# Patient Record
Sex: Female | Born: 1960 | Race: White | Hispanic: No | Marital: Single | Smoking: Former smoker
Health system: Southern US, Community
[De-identification: ages and names within clinical notes are randomized; demographics above are authoritative.]

## PROBLEM LIST (undated history)

## (undated) DIAGNOSIS — K859 Acute pancreatitis without necrosis or infection, unspecified: Secondary | ICD-10-CM

## (undated) DIAGNOSIS — R4586 Emotional lability: Secondary | ICD-10-CM

## (undated) DIAGNOSIS — G4700 Insomnia, unspecified: Secondary | ICD-10-CM

## (undated) DIAGNOSIS — M549 Dorsalgia, unspecified: Secondary | ICD-10-CM

## (undated) HISTORY — PX: TUBAL LIGATION: SHX77

---

## 2012-07-12 ENCOUNTER — Emergency Department (HOSPITAL_BASED_OUTPATIENT_CLINIC_OR_DEPARTMENT_OTHER): Payer: Self-pay

## 2012-07-12 ENCOUNTER — Encounter (HOSPITAL_BASED_OUTPATIENT_CLINIC_OR_DEPARTMENT_OTHER): Payer: Self-pay | Admitting: Emergency Medicine

## 2012-07-12 ENCOUNTER — Emergency Department (HOSPITAL_BASED_OUTPATIENT_CLINIC_OR_DEPARTMENT_OTHER)
Admission: EM | Admit: 2012-07-12 | Discharge: 2012-07-12 | Disposition: A | Discharge status: Home / Self Care | Payer: Self-pay | Attending: Emergency Medicine | Admitting: Emergency Medicine

## 2012-07-12 DIAGNOSIS — R9389 Abnormal findings on diagnostic imaging of other specified body structures: Secondary | ICD-10-CM

## 2012-07-12 DIAGNOSIS — J208 Acute bronchitis due to other specified organisms: Secondary | ICD-10-CM

## 2012-07-12 DIAGNOSIS — F17200 Nicotine dependence, unspecified, uncomplicated: Secondary | ICD-10-CM | POA: Insufficient documentation

## 2012-07-12 DIAGNOSIS — J209 Acute bronchitis, unspecified: Secondary | ICD-10-CM | POA: Insufficient documentation

## 2012-07-12 HISTORY — DX: Acute pancreatitis without necrosis or infection, unspecified: K85.90

## 2012-07-12 HISTORY — DX: Emotional lability: R45.86

## 2012-07-12 HISTORY — DX: Insomnia, unspecified: G47.00

## 2012-07-12 MED ORDER — ALBUTEROL SULFATE HFA 108 (90 BASE) MCG/ACT IN AERS
2.0000 | INHALATION_SPRAY | Freq: Once | RESPIRATORY_TRACT | Status: AC
  Administered 2012-07-12: 2 via RESPIRATORY_TRACT
  Filled 2012-07-12: qty 6.7

## 2012-07-12 NOTE — ED Provider Notes (Signed)
History     CSN: [id number]  Arrival date & time 07/12/12  1700   First MD Initiated Contact with Patient 07/12/12 1738      Chief Complaint  Patient presents with  . Cough  . Shortness of Breath  . Back Pain    (Consider location/radiation/quality/duration/timing/severity/associated sxs/prior treatment) HPI This 51 year old female has about a one-week history of cough with right upper back and right upper chest pain with coughing and only minimal shortness breath at times denies shortness today. She has not been wheezing. She is no exertional chest pain. She is no abdominal pain or vomiting. She is no rash or fever. She is no confusion. There is no treatment prior to arrival. Her cough is been persistent for the last week with yellow sputum production so she came to the ED for evaluation. Her chest and back pain are constant 24 hours a day for the last week worse with cough and palpation and position changes but nonexertional. Past Medical History  Diagnosis Date  . Mood swings   . Insomnia   . Pancreatitis     Past Surgical History  Procedure Date  . Tubal ligation     No family history on file.  History  Substance Use Topics  . Smoking status: Current Everyday Smoker -- 0.5 packs/day  . Smokeless tobacco: Never Used  . Alcohol Use: No    OB History    Grav Para Term Preterm Abortions TAB SAB Ect Mult Living                  Review of Systems 10 Systems reviewed and are negative for acute change except as noted in the HPI. Allergies  Review of patient's allergies indicates no known allergies.  Home Medications   Current Outpatient Rx  Name Route Sig Dispense Refill  . GABAPENTIN 800 MG PO TABS Oral Take 800 mg by mouth.    . QUETIAPINE FUMARATE 100 MG PO TABS Oral Take 100 mg by mouth at bedtime.      BP 115/85  Pulse 77  Temp 97.6 F (36.4 C) (Oral)  Resp 20  Ht 5\' 7"  (1.702 m)  Wt 150 lb (68.04 kg)  BMI 23.49 kg/m2  SpO2 100%  Physical Exam    Nursing note and vitals reviewed. Constitutional:       Awake, alert, nontoxic appearance.  HENT:  Head: Atraumatic.  Eyes: Right eye exhibits no discharge. Left eye exhibits no discharge.  Neck: Neck supple.  Cardiovascular: Normal rate and regular rhythm.   No murmur heard. Pulmonary/Chest: Effort normal and breath sounds normal. No respiratory distress. She has no wheezes. She has no rales. She exhibits tenderness.       Reproducible anterior chest wall and right upper back tenderness to palpation without deformity or rash noted  Abdominal: Soft. There is no tenderness. There is no rebound.  Musculoskeletal: She exhibits no edema and no tenderness.       Baseline ROM, no obvious new focal weakness.  Neurological:       Mental status and motor strength appears baseline for patient and situation.  Skin: No rash noted.  Psychiatric: She has a normal mood and affect.    ED Course  Procedures (including critical care time)  Labs Reviewed - No data to display Dg Chest 2 View  07/12/2012  *RADIOLOGY REPORT*  Clinical Data: Cough.  Shortness of breath.  Back pain.  Smoker.  CHEST - 2 VIEW  Comparison: None.  Findings: Vague linear  vertically oriented opacity in the left upper lobe on the PA image, not visualized on the lateral.  Lungs otherwise clear.  Bronchovascular markings normal.  No pleural effusions.  Cardiomediastinal silhouette unremarkable.  Visualized bony thorax intact.  IMPRESSION: Vague linear opacity in the left upper lobe, likely a small focus of scar.  Otherwise normal examination.  Follow-up chest x-ray in 2-3 months is suggested to confirm stability, in the absence of prior examinations for comparison.  Original Report Authenticated By: Arnell Sieving, M.D.     1. Acute viral bronchitis   2. Abnormal finding on chest xray       MDM  Patient / Family / Caregiver informed of clinical course, understand medical decision-making process, and agree with  plan.         Hurman Horn, MD 07/13/12 [phone]

## 2012-07-12 NOTE — ED Notes (Signed)
Cough, SOB and back pain with deep breath for "over a week". Coughing up yellow sputum.

## 2012-07-22 ENCOUNTER — Emergency Department (HOSPITAL_BASED_OUTPATIENT_CLINIC_OR_DEPARTMENT_OTHER)
Admission: EM | Admit: 2012-07-22 | Discharge: 2012-07-22 | Disposition: A | Discharge status: Home / Self Care | Payer: Self-pay | Attending: Emergency Medicine | Admitting: Emergency Medicine

## 2012-07-22 ENCOUNTER — Encounter (HOSPITAL_BASED_OUTPATIENT_CLINIC_OR_DEPARTMENT_OTHER): Payer: Self-pay | Admitting: *Deleted

## 2012-07-22 DIAGNOSIS — K859 Acute pancreatitis without necrosis or infection, unspecified: Secondary | ICD-10-CM | POA: Insufficient documentation

## 2012-07-22 DIAGNOSIS — F17200 Nicotine dependence, unspecified, uncomplicated: Secondary | ICD-10-CM | POA: Insufficient documentation

## 2012-07-22 DIAGNOSIS — G4700 Insomnia, unspecified: Secondary | ICD-10-CM | POA: Insufficient documentation

## 2012-07-22 DIAGNOSIS — J069 Acute upper respiratory infection, unspecified: Secondary | ICD-10-CM | POA: Insufficient documentation

## 2012-07-22 DIAGNOSIS — K0889 Other specified disorders of teeth and supporting structures: Secondary | ICD-10-CM

## 2012-07-22 MED ORDER — HYDROCODONE-ACETAMINOPHEN 5-325 MG PO TABS: 2.0000 | ORAL_TABLET | ORAL | Status: AC | PRN

## 2012-07-22 MED ORDER — CLINDAMYCIN HCL 300 MG PO CAPS: ORAL_CAPSULE | ORAL | Status: DC

## 2012-07-22 NOTE — ED Provider Notes (Signed)
History     CSN: [id number]  Arrival date & time 07/22/12  1619   First MD Initiated Contact with Patient 07/22/12 1655      Chief Complaint  Patient presents with  . URI    (Consider location/radiation/quality/duration/timing/severity/associated sxs/prior treatment) Patient is a 51 y.o. female presenting with URI. The history is provided by the patient. No language interpreter was used.  URI The primary symptoms include ear pain. The current episode started today. This is a new problem.  Symptoms associated with the illness include facial pain and sinus pressure.  Pt complains of pain in her right ear and right face.   Pt thinks pain may be from a broken tooth.  Past Medical History  Diagnosis Date  . Mood swings   . Insomnia   . Pancreatitis     Past Surgical History  Procedure Date  . Tubal ligation     No family history on file.  History  Substance Use Topics  . Smoking status: Current Everyday Smoker -- 0.5 packs/day  . Smokeless tobacco: Never Used  . Alcohol Use: No    OB History    Grav Para Term Preterm Abortions TAB SAB Ect Mult Living                  Review of Systems  HENT: Positive for ear pain and sinus pressure.   All other systems reviewed and are negative.    Allergies  Review of patient's allergies indicates no known allergies.  Home Medications   Current Outpatient Rx  Name Route Sig Dispense Refill  . GABAPENTIN 800 MG PO TABS Oral Take 800 mg by mouth.    . QUETIAPINE FUMARATE 100 MG PO TABS Oral Take 100 mg by mouth at bedtime.      BP 120/74  Pulse 72  Temp 97.8 F (36.6 C) (Oral)  Resp 18  Ht 5\' 7"  (1.702 m)  Wt 154 lb (69.854 kg)  BMI 24.12 kg/m2  SpO2 99%  Physical Exam  Nursing note and vitals reviewed. Constitutional: She is oriented to person, place, and time. She appears well-developed and well-nourished.  HENT:  Head: Normocephalic and atraumatic.       Broken tooth upper right gum.  Swelling around gum    Eyes: Conjunctivae and EOM are normal. Pupils are equal, round, and reactive to light.  Neck: Normal range of motion.  Cardiovascular: Normal rate and normal heart sounds.   Pulmonary/Chest: Effort normal and breath sounds normal.  Abdominal: Soft.  Neurological: She is alert and oriented to person, place, and time.  Skin: Skin is warm.  Psychiatric: She has a normal mood and affect.    ED Course  Procedures (including critical care time)  Labs Reviewed - No data to display No results found.   No diagnosis found.    MDM  Clindamycin and hydrocodone.  Schedule to see Dr. Ninetta Lights.         Lonia Skinner [place], [place] 07/22/12 1757

## 2012-07-22 NOTE — ED Notes (Signed)
Head neck and right ear pain x 1 week.

## 2012-07-22 NOTE — ED Provider Notes (Signed)
Medical screening examination/treatment/procedure(s) were performed by non-physician practitioner and as supervising physician I was immediately available for consultation/collaboration.   Gwyneth Sprout, MD 07/22/12 2308

## 2012-07-28 ENCOUNTER — Emergency Department (HOSPITAL_BASED_OUTPATIENT_CLINIC_OR_DEPARTMENT_OTHER)
Admission: EM | Admit: 2012-07-28 | Discharge: 2012-07-28 | Disposition: A | Discharge status: Home / Self Care | Payer: Self-pay | Attending: Emergency Medicine | Admitting: Emergency Medicine

## 2012-07-28 ENCOUNTER — Encounter (HOSPITAL_BASED_OUTPATIENT_CLINIC_OR_DEPARTMENT_OTHER): Payer: Self-pay | Admitting: Emergency Medicine

## 2012-07-28 DIAGNOSIS — T7840XA Allergy, unspecified, initial encounter: Secondary | ICD-10-CM

## 2012-07-28 DIAGNOSIS — F17200 Nicotine dependence, unspecified, uncomplicated: Secondary | ICD-10-CM | POA: Insufficient documentation

## 2012-07-28 DIAGNOSIS — K859 Acute pancreatitis without necrosis or infection, unspecified: Secondary | ICD-10-CM | POA: Insufficient documentation

## 2012-07-28 DIAGNOSIS — T368X5A Adverse effect of other systemic antibiotics, initial encounter: Secondary | ICD-10-CM | POA: Insufficient documentation

## 2012-07-28 DIAGNOSIS — G4700 Insomnia, unspecified: Secondary | ICD-10-CM | POA: Insufficient documentation

## 2012-07-28 MED ORDER — PENICILLIN V POTASSIUM 250 MG PO TABS
500.0000 mg | ORAL_TABLET | Freq: Once | ORAL | Status: AC
  Administered 2012-07-28: 500 mg via ORAL
  Filled 2012-07-28: qty 2

## 2012-07-28 MED ORDER — PREDNISONE 20 MG PO TABS
40.0000 mg | ORAL_TABLET | Freq: Once | ORAL | Status: AC
  Administered 2012-07-28: 40 mg via ORAL
  Filled 2012-07-28: qty 2

## 2012-07-28 MED ORDER — PREDNISONE 20 MG PO TABS: 40.0000 mg | ORAL_TABLET | Freq: Every day | ORAL | Status: AC

## 2012-07-28 MED ORDER — HYDROXYZINE HCL 25 MG PO TABS: 25.0000 mg | ORAL_TABLET | Freq: Four times a day (QID) | ORAL | Status: AC | PRN

## 2012-07-28 NOTE — ED Notes (Signed)
Pt thinks she might be having a reaction to clindamycin.  Pt has generalized itchy rash.  Also c/o pain and swelling to hands.

## 2012-07-28 NOTE — Discharge Instructions (Signed)

## 2012-07-28 NOTE — ED Provider Notes (Signed)
History     CSN: [id number]  Arrival date & time 07/28/12  1003   First MD Initiated Contact with Patient 07/28/12 1027      Chief Complaint  Patient presents with  . Allergic Reaction    (Consider location/radiation/quality/duration/timing/severity/associated sxs/prior treatment) HPI Comments: Pt was seen on 8/19 and given analgesics and clindamycin abx for dental abscess.  She reports tooth getting better, has not yet made an appt with dentist.  She began developing some itching to scalp a couple of days ago, but now with red rash to medial upper thighs, groin, low back, feet, arms, and has swelling to both hands making it painful to move fingers since yesterday.  Benadryl helped only minimally.  No throat swelling, no SOB, cough, wheezing.  No N/V/D.  She denies any other new meds, soaps, detergents, perfumes.    Patient is a 51 y.o. female presenting with allergic reaction. The history is provided by the patient.  Allergic Reaction The primary symptoms are  rash. The primary symptoms do not include wheezing, shortness of breath, cough, vomiting or diarrhea.    Past Medical History  Diagnosis Date  . Mood swings   . Insomnia   . Pancreatitis     Past Surgical History  Procedure Date  . Tubal ligation     History reviewed. No pertinent family history.  History  Substance Use Topics  . Smoking status: Current Everyday Smoker -- 0.5 packs/day  . Smokeless tobacco: Never Used  . Alcohol Use: No    OB History    Grav Para Term Preterm Abortions TAB SAB Ect Mult Living                  Review of Systems  Constitutional: Negative for fever and chills.  HENT: Positive for dental problem. Negative for trouble swallowing.   Respiratory: Negative for cough, shortness of breath and wheezing.   Gastrointestinal: Negative for vomiting and diarrhea.  Genitourinary: Negative for dysuria and hematuria.  Musculoskeletal: Positive for joint swelling and arthralgias.  Skin:  Positive for rash.  All other systems reviewed and are negative.    Allergies  Review of patient's allergies indicates no known allergies.  Home Medications   Current Outpatient Rx  Name Route Sig Dispense Refill  . CLINDAMYCIN HCL 300 MG PO CAPS  One tablet qid 28 capsule 0  . GABAPENTIN 800 MG PO TABS Oral Take 800 mg by mouth.    Marland Kitchen HYDROCODONE-ACETAMINOPHEN 5-325 MG PO TABS Oral Take 2 tablets by mouth every 4 (four) hours as needed for pain. 20 tablet 0  . HYDROXYZINE HCL 25 MG PO TABS Oral Take 1 tablet (25 mg total) by mouth every 6 (six) hours as needed for itching. 15 tablet 0  . PREDNISONE 20 MG PO TABS Oral Take 2 tablets (40 mg total) by mouth daily. 12 tablet 0  . QUETIAPINE FUMARATE 100 MG PO TABS Oral Take 100 mg by mouth at bedtime.      BP 129/76  Pulse 74  Temp 97.6 F (36.4 C) (Oral)  Resp 20  Ht 5\' 7"  (1.702 m)  Wt 154 lb (69.854 kg)  BMI 24.12 kg/m2  SpO2 100%  Physical Exam  Nursing note and vitals reviewed. Constitutional: She appears well-developed and well-nourished.  HENT:  Head: Normocephalic and atraumatic.  Eyes: Pupils are equal, round, and reactive to light.  Neck: Trachea normal, normal range of motion and phonation normal. Neck supple.  Cardiovascular: Normal rate.   Pulmonary/Chest: Effort normal  and breath sounds normal. No stridor. No respiratory distress. She has no decreased breath sounds. She has no wheezes. She has no rhonchi. She has no rales.  Abdominal: Soft.  Lymphadenopathy:    She has no cervical adenopathy.  Neurological: She is alert.  Skin: Skin is warm and dry. Rash noted. No petechiae and no purpura noted. Rash is maculopapular. Rash is not pustular and not vesicular. She is not diaphoretic. No pallor.    ED Course  Procedures (including critical care time)  Labs Reviewed - No data to display No results found.   1. Allergic reaction to drug    Room air saturation is 100% and I interpret this to be  normal.   MDM  Pt with slightly delayed hypersensitivity, likely to clindamycin.  Will add to allergy list and give prednisone, change to PCN.  Pt urged to follow up with dentist.          Gavin Pound. [place], MD 07/28/12 1044

## 2012-09-24 ENCOUNTER — Encounter (HOSPITAL_BASED_OUTPATIENT_CLINIC_OR_DEPARTMENT_OTHER): Payer: Self-pay

## 2012-09-24 ENCOUNTER — Emergency Department (HOSPITAL_BASED_OUTPATIENT_CLINIC_OR_DEPARTMENT_OTHER)
Admission: EM | Admit: 2012-09-24 | Discharge: 2012-09-24 | Discharge status: Against Medical Advice | Payer: Self-pay | Attending: Emergency Medicine | Admitting: Emergency Medicine

## 2012-09-24 DIAGNOSIS — K089 Disorder of teeth and supporting structures, unspecified: Secondary | ICD-10-CM | POA: Insufficient documentation

## 2012-09-24 NOTE — ED Notes (Signed)
Pt reports dental pain x 1 week.  °

## 2012-09-24 NOTE — ED Notes (Signed)
Pt sts she has to go pick up her grandchildren at school and cannot wait to be seen.  Sts she will be back later today.

## 2012-12-07 ENCOUNTER — Emergency Department (HOSPITAL_BASED_OUTPATIENT_CLINIC_OR_DEPARTMENT_OTHER)
Admission: EM | Admit: 2012-12-07 | Discharge: 2012-12-07 | Disposition: A | Discharge status: Home / Self Care | Payer: Self-pay | Attending: Emergency Medicine | Admitting: Emergency Medicine

## 2012-12-07 ENCOUNTER — Encounter (HOSPITAL_BASED_OUTPATIENT_CLINIC_OR_DEPARTMENT_OTHER): Payer: Self-pay | Admitting: *Deleted

## 2012-12-07 DIAGNOSIS — Z8719 Personal history of other diseases of the digestive system: Secondary | ICD-10-CM | POA: Insufficient documentation

## 2012-12-07 DIAGNOSIS — K0889 Other specified disorders of teeth and supporting structures: Secondary | ICD-10-CM

## 2012-12-07 DIAGNOSIS — F39 Unspecified mood [affective] disorder: Secondary | ICD-10-CM | POA: Insufficient documentation

## 2012-12-07 DIAGNOSIS — Z79899 Other long term (current) drug therapy: Secondary | ICD-10-CM | POA: Insufficient documentation

## 2012-12-07 DIAGNOSIS — K089 Disorder of teeth and supporting structures, unspecified: Secondary | ICD-10-CM | POA: Insufficient documentation

## 2012-12-07 DIAGNOSIS — F17200 Nicotine dependence, unspecified, uncomplicated: Secondary | ICD-10-CM | POA: Insufficient documentation

## 2012-12-07 DIAGNOSIS — Z792 Long term (current) use of antibiotics: Secondary | ICD-10-CM | POA: Insufficient documentation

## 2012-12-07 MED ORDER — OXYCODONE-ACETAMINOPHEN 5-325 MG PO TABS: 12.0000 | ORAL_TABLET | ORAL | Status: DC | PRN

## 2012-12-07 NOTE — ED Provider Notes (Signed)
History     CSN: [id number]  Arrival date & time 12/07/12  2115   First MD Initiated Contact with Patient 12/07/12 2136      Chief Complaint  Patient presents with  . Dental Pain    (Consider location/radiation/quality/duration/timing/severity/associated sxs/prior treatment) HPI Comments: 52 y/o female presents to the ED complaining of right lower tooth pain for the past week worsening over the past 2 days. States she has multiple chipped teeth and occasionally feels pieces chipping off randomly. She does not have a dentist. Describes pain as sharp, rated 10/10 radiating to her right ear. Denies fever, chills or difficulty swallowing. Chewing makes the pain worse. Tried taking ibuprofen without relief.  The history is provided by the patient.    Past Medical History  Diagnosis Date  . Mood swings   . Insomnia   . Pancreatitis     Past Surgical History  Procedure Date  . Tubal ligation     No family history on file.  History  Substance Use Topics  . Smoking status: Current Every Day Smoker -- 0.5 packs/day  . Smokeless tobacco: Never Used  . Alcohol Use: No    OB History    Grav Para Term Preterm Abortions TAB SAB Ect Mult Living                  Review of Systems  Constitutional: Negative for fever and chills.  HENT: Positive for dental problem. Negative for trouble swallowing.   Neurological: Negative for headaches.    Allergies  Clindamycin/lincomycin  Home Medications   Current Outpatient Rx  Name  Route  Sig  Dispense  Refill  . CLINDAMYCIN HCL 300 MG PO CAPS      One tablet qid   28 capsule   0   . GABAPENTIN 800 MG PO TABS   Oral   Take 800 mg by mouth.         . QUETIAPINE FUMARATE 100 MG PO TABS   Oral   Take 100 mg by mouth at bedtime.           BP 130/83  Pulse 76  Temp 97.6 F (36.4 C) (Oral)  Resp 16  SpO2 100%  Physical Exam  Nursing note and vitals reviewed. Constitutional: She is oriented to person, place, and  time. She appears well-developed and well-nourished. No distress.  HENT:  Head: Normocephalic and atraumatic.  Mouth/Throat: Oropharynx is clear and moist. Abnormal dentition. Dental caries present. No dental abscesses.    Eyes: Conjunctivae normal are normal.  Neck: Normal range of motion. Neck supple.  Cardiovascular: Normal rate, regular rhythm and normal heart sounds.   Pulmonary/Chest: Effort normal and breath sounds normal.  Musculoskeletal: Normal range of motion. She exhibits no edema.  Lymphadenopathy:       Head (right side): No submental and no submandibular adenopathy present.       Head (left side): No submental and no submandibular adenopathy present.    She has no cervical adenopathy.  Neurological: She is alert and oriented to person, place, and time.  Skin: Skin is warm.  Psychiatric: She has a normal mood and affect. Her behavior is normal.    ED Course  Procedures (including critical care time)  Labs Reviewed - No data to display No results found.   1. Pain, dental       MDM  51 y/o female with dental pain without dental infection. She is aware she needs to see dentist for her decaying teeth.  Rx percocet #6. Return precautions discussed. Patient states understanding of plan and is agreeable.         Trevor Mace, PA-C 12/07/12 2203

## 2012-12-07 NOTE — ED Notes (Signed)
Pt c/o lower right rear tooth pain x2 days.

## 2012-12-07 NOTE — ED Provider Notes (Signed)
Medical screening examination/treatment/procedure(s) were performed by non-physician practitioner and as supervising physician I was immediately available for consultation/collaboration.   Fatou Dunnigan, MD 12/07/12 2317 

## 2013-01-13 ENCOUNTER — Encounter (HOSPITAL_BASED_OUTPATIENT_CLINIC_OR_DEPARTMENT_OTHER): Payer: Self-pay | Admitting: *Deleted

## 2013-01-13 ENCOUNTER — Emergency Department (HOSPITAL_BASED_OUTPATIENT_CLINIC_OR_DEPARTMENT_OTHER)
Admission: EM | Admit: 2013-01-13 | Discharge: 2013-01-13 | Disposition: A | Discharge status: Home / Self Care | Payer: Self-pay | Attending: Emergency Medicine | Admitting: Emergency Medicine

## 2013-01-13 ENCOUNTER — Emergency Department (HOSPITAL_BASED_OUTPATIENT_CLINIC_OR_DEPARTMENT_OTHER): Payer: Self-pay

## 2013-01-13 DIAGNOSIS — Z8659 Personal history of other mental and behavioral disorders: Secondary | ICD-10-CM | POA: Insufficient documentation

## 2013-01-13 DIAGNOSIS — R059 Cough, unspecified: Secondary | ICD-10-CM | POA: Insufficient documentation

## 2013-01-13 DIAGNOSIS — Z8719 Personal history of other diseases of the digestive system: Secondary | ICD-10-CM | POA: Insufficient documentation

## 2013-01-13 DIAGNOSIS — Z79899 Other long term (current) drug therapy: Secondary | ICD-10-CM | POA: Insufficient documentation

## 2013-01-13 DIAGNOSIS — J40 Bronchitis, not specified as acute or chronic: Secondary | ICD-10-CM

## 2013-01-13 DIAGNOSIS — J209 Acute bronchitis, unspecified: Secondary | ICD-10-CM | POA: Insufficient documentation

## 2013-01-13 DIAGNOSIS — F17200 Nicotine dependence, unspecified, uncomplicated: Secondary | ICD-10-CM | POA: Insufficient documentation

## 2013-01-13 DIAGNOSIS — R05 Cough: Secondary | ICD-10-CM | POA: Insufficient documentation

## 2013-01-13 LAB — URINALYSIS, ROUTINE W REFLEX MICROSCOPIC
Hgb urine dipstick: NEGATIVE
Specific Gravity, Urine: 1.017 (ref 1.005–1.030)
Urobilinogen, UA: 0.2 mg/dL (ref 0.0–1.0)

## 2013-01-13 MED ORDER — CEPHALEXIN 500 MG PO CAPS: 500.0000 mg | ORAL_CAPSULE | Freq: Four times a day (QID) | ORAL | Status: DC

## 2013-01-13 MED ORDER — ALBUTEROL SULFATE HFA 108 (90 BASE) MCG/ACT IN AERS: 1.0000 | INHALATION_SPRAY | Freq: Four times a day (QID) | RESPIRATORY_TRACT | Status: DC | PRN

## 2013-01-13 MED ORDER — HYDROCODONE-ACETAMINOPHEN 5-325 MG PO TABS: 2.0000 | ORAL_TABLET | ORAL | Status: DC | PRN

## 2013-01-13 MED ORDER — ALBUTEROL SULFATE HFA 108 (90 BASE) MCG/ACT IN AERS
2.0000 | INHALATION_SPRAY | RESPIRATORY_TRACT | Status: DC | PRN
  Administered 2013-01-13: 2 via RESPIRATORY_TRACT
  Filled 2013-01-13: qty 6.7

## 2013-01-13 NOTE — ED Notes (Signed)
Pt c/o upper and lower back pain x 1 week

## 2013-01-13 NOTE — ED Provider Notes (Signed)
History     CSN: [id number]  Arrival date & time 01/13/13  8413   First MD Initiated Contact with Patient 01/13/13 1922      Chief Complaint  Patient presents with  . Back Pain    (Consider location/radiation/quality/duration/timing/severity/associated sxs/prior treatment) Patient is a 52 y.o. female presenting with cough. The history is provided by the patient. No language interpreter was used.  Cough Cough characteristics:  Productive Sputum characteristics:  Nondescript Severity:  Moderate Onset quality:  Gradual Timing:  Constant Progression:  Worsening Chronicity:  New Smoker: yes   Relieved by:  Nothing Worsened by:  Nothing tried Ineffective treatments:  None tried Pt complains of a cough and pain in the left side of her back.  Pt reports she has had frequent urination  Past Medical History  Diagnosis Date  . Mood swings   . Insomnia   . Pancreatitis     Past Surgical History  Procedure Laterality Date  . Tubal ligation      History reviewed. No pertinent family history.  History  Substance Use Topics  . Smoking status: Current Every Day Smoker -- 0.50 packs/day    Types: Cigarettes  . Smokeless tobacco: Never Used  . Alcohol Use: No    OB History   Grav Para Term Preterm Abortions TAB SAB Ect Mult Living                  Review of Systems  Respiratory: Positive for cough.   All other systems reviewed and are negative.    Allergies  Clindamycin/lincomycin  Home Medications   Current Outpatient Rx  Name  Route  Sig  Dispense  Refill  . mirtazapine (REMERON) 15 MG tablet   Oral   Take 15 mg by mouth at bedtime.         . clindamycin (CLEOCIN) 300 MG capsule      One tablet qid   28 capsule   0   . gabapentin (NEURONTIN) 800 MG tablet   Oral   Take 800 mg by mouth.         . oxyCODONE-acetaminophen (PERCOCET) 5-325 MG per tablet   Oral   Take 12 tablets by mouth every 4 (four) hours as needed for pain.   6 tablet   0    . QUEtiapine (SEROQUEL) 100 MG tablet   Oral   Take 100 mg by mouth at bedtime.           BP 133/74  Pulse 78  Temp(Src) 97.6 F (36.4 C) (Oral)  Resp 16  SpO2 99%  Physical Exam  Nursing note and vitals reviewed. Constitutional: She appears well-developed and well-nourished.  HENT:  Head: Normocephalic.  Right Ear: External ear normal.  Left Ear: External ear normal.  Eyes: Conjunctivae are normal. Pupils are equal, round, and reactive to light.  Neck: Normal range of motion. Neck supple.  Cardiovascular: Normal rate and normal heart sounds.   Pulmonary/Chest: Effort normal.  Abdominal: Soft.  Musculoskeletal: Normal range of motion.  Neurological: She is alert.  Skin: Skin is warm.  Psychiatric: She has a normal mood and affect.    ED Course  Procedures (including critical care time)  Labs Reviewed  URINALYSIS, ROUTINE W REFLEX MICROSCOPIC   Dg Chest 2 View  01/13/2013  *RADIOLOGY REPORT*  Clinical Data: Cough.  Bronchitis.  Tobacco use.  CHEST - 2 VIEW  Comparison: 07/12/2012  Findings: Cardiac and mediastinal contours appear normal.  The lungs appear clear.  No pleural  effusion is identified.  Prior linear opacity at the left lung apex has resolved.  IMPRESSION:  1.  No significant abnormality identified.   Original Report Authenticated By: Gaylyn Rong, M.D.      No diagnosis found.    MDM  Pt given rx for zithromax and albuterol,  Hydrocodone for pain.   Pt advised to see her MD for recheck in 3-4 days        Lonia Skinner [place], [place] 01/13/13 2024

## 2013-01-13 NOTE — ED Notes (Signed)
Pt requested albuterol inhaler to go due to expense-states she can afford the abx abd pain med-EDPA notified-orders for albuterol inhaler

## 2013-01-13 NOTE — ED Provider Notes (Signed)
Medical screening examination/treatment/procedure(s) were performed by non-physician practitioner and as supervising physician I was immediately available for consultation/collaboration.   Billie Intriago, MD 01/13/13 2257 

## 2013-07-20 ENCOUNTER — Encounter (HOSPITAL_BASED_OUTPATIENT_CLINIC_OR_DEPARTMENT_OTHER): Payer: Self-pay

## 2013-07-20 ENCOUNTER — Emergency Department (HOSPITAL_BASED_OUTPATIENT_CLINIC_OR_DEPARTMENT_OTHER)
Admission: EM | Admit: 2013-07-20 | Discharge: 2013-07-20 | Discharge status: Against Medical Advice | Payer: BC Managed Care – PPO | Attending: Emergency Medicine | Admitting: Emergency Medicine

## 2013-07-20 DIAGNOSIS — R1110 Vomiting, unspecified: Secondary | ICD-10-CM | POA: Insufficient documentation

## 2013-07-20 DIAGNOSIS — F17200 Nicotine dependence, unspecified, uncomplicated: Secondary | ICD-10-CM | POA: Insufficient documentation

## 2013-07-20 DIAGNOSIS — R109 Unspecified abdominal pain: Secondary | ICD-10-CM | POA: Insufficient documentation

## 2013-07-20 LAB — URINALYSIS, ROUTINE W REFLEX MICROSCOPIC
Glucose, UA: NEGATIVE mg/dL
Protein, ur: NEGATIVE mg/dL
Urobilinogen, UA: 0.2 mg/dL (ref 0.0–1.0)

## 2013-07-20 LAB — URINE MICROSCOPIC-ADD ON

## 2013-07-20 NOTE — ED Notes (Signed)
Called in waiting area x1, no answer 

## 2013-07-20 NOTE — ED Notes (Signed)
Patient here with upper epigastric pain x 1 week with radiation to scapula. Pain awakening her at night with vomiting, worse after any solid intake

## 2013-07-22 LAB — URINE CULTURE

## 2013-07-24 ENCOUNTER — Encounter (HOSPITAL_BASED_OUTPATIENT_CLINIC_OR_DEPARTMENT_OTHER): Payer: Self-pay | Admitting: Emergency Medicine

## 2013-07-24 ENCOUNTER — Emergency Department (HOSPITAL_BASED_OUTPATIENT_CLINIC_OR_DEPARTMENT_OTHER): Payer: BC Managed Care – PPO

## 2013-07-24 ENCOUNTER — Emergency Department (HOSPITAL_BASED_OUTPATIENT_CLINIC_OR_DEPARTMENT_OTHER)
Admission: EM | Admit: 2013-07-24 | Discharge: 2013-07-25 | Disposition: A | Discharge status: Home / Self Care | Payer: BC Managed Care – PPO | Attending: Emergency Medicine | Admitting: Emergency Medicine

## 2013-07-24 DIAGNOSIS — F17200 Nicotine dependence, unspecified, uncomplicated: Secondary | ICD-10-CM | POA: Insufficient documentation

## 2013-07-24 DIAGNOSIS — F39 Unspecified mood [affective] disorder: Secondary | ICD-10-CM | POA: Insufficient documentation

## 2013-07-24 DIAGNOSIS — Z8719 Personal history of other diseases of the digestive system: Secondary | ICD-10-CM | POA: Insufficient documentation

## 2013-07-24 DIAGNOSIS — G4700 Insomnia, unspecified: Secondary | ICD-10-CM | POA: Insufficient documentation

## 2013-07-24 DIAGNOSIS — Z79899 Other long term (current) drug therapy: Secondary | ICD-10-CM | POA: Insufficient documentation

## 2013-07-24 DIAGNOSIS — N390 Urinary tract infection, site not specified: Secondary | ICD-10-CM | POA: Insufficient documentation

## 2013-07-24 LAB — COMPREHENSIVE METABOLIC PANEL
Albumin: 4.9 g/dL (ref 3.5–5.2)
Alkaline Phosphatase: 87 U/L (ref 39–117)
BUN: 22 mg/dL (ref 6–23)
Calcium: 11.8 mg/dL — ABNORMAL HIGH (ref 8.4–10.5)
Potassium: 3.9 mEq/L (ref 3.5–5.1)
Sodium: 140 mEq/L (ref 135–145)
Total Protein: 8.3 g/dL (ref 6.0–8.3)

## 2013-07-24 LAB — URINALYSIS, ROUTINE W REFLEX MICROSCOPIC
Ketones, ur: 15 mg/dL — AB
Nitrite: POSITIVE — AB
Protein, ur: 30 mg/dL — AB
Urobilinogen, UA: 1 mg/dL (ref 0.0–1.0)

## 2013-07-24 LAB — URINE MICROSCOPIC-ADD ON

## 2013-07-24 LAB — CBC WITH DIFFERENTIAL/PLATELET
Basophils Relative: 0 % (ref 0–1)
Eosinophils Absolute: 0.1 10*3/uL (ref 0.0–0.7)
MCH: 33 pg (ref 26.0–34.0)
MCHC: 34.5 g/dL (ref 30.0–36.0)
Neutrophils Relative %: 52 % (ref 43–77)
Platelets: 209 10*3/uL (ref 150–400)

## 2013-07-24 LAB — LIPASE, BLOOD: Lipase: 41 U/L (ref 11–59)

## 2013-07-24 MED ORDER — ONDANSETRON 8 MG PO TBDP: 8.0000 mg | ORAL_TABLET | Freq: Three times a day (TID) | ORAL | Status: DC | PRN

## 2013-07-24 MED ORDER — DEXTROSE 5 % IV SOLN
1.0000 g | Freq: Once | INTRAVENOUS | Status: AC
  Administered 2013-07-24: 1 g via INTRAVENOUS
  Filled 2013-07-24 (×2): qty 10

## 2013-07-24 MED ORDER — SODIUM CHLORIDE 0.9 % IV BOLUS (SEPSIS)
1000.0000 mL | Freq: Once | INTRAVENOUS | Status: AC
  Administered 2013-07-24: 1000 mL via INTRAVENOUS

## 2013-07-24 MED ORDER — ONDANSETRON HCL 4 MG/2ML IJ SOLN
4.0000 mg | Freq: Once | INTRAMUSCULAR | Status: AC
  Administered 2013-07-24: 4 mg via INTRAVENOUS
  Filled 2013-07-24: qty 2

## 2013-07-24 MED ORDER — CEPHALEXIN 500 MG PO CAPS: 500.0000 mg | ORAL_CAPSULE | Freq: Three times a day (TID) | ORAL | Status: DC

## 2013-07-24 MED ORDER — OXYCODONE-ACETAMINOPHEN 5-325 MG PO TABS: 2.0000 | ORAL_TABLET | ORAL | Status: DC | PRN

## 2013-07-24 NOTE — ED Notes (Signed)
Pt c/o mid-abd pain through to back x 1 month. Pt reports nausea.

## 2013-07-24 NOTE — ED Notes (Signed)
MD at bedside giving test results and plan of care for DC. 

## 2013-07-24 NOTE — ED Provider Notes (Signed)
CSN: [id number]     Arrival date & time 07/24/13  1953 History     First MD Initiated Contact with Patient 07/24/13 2052     Chief Complaint  Patient presents with  . Abdominal Pain   (Consider location/radiation/quality/duration/timing/severity/associated sxs/prior Treatment) HPI Patient with intermittent abdominal pain for two weeks worse with food.  Sharp in luq to left flank.  She has not had nausea or vomiting or change in weight.  She states greasy food makes it worse.  She has not had fever or chills.and denies alcohol intake.   Past Medical History  Diagnosis Date  . Mood swings   . Insomnia   . Pancreatitis    Past Surgical History  Procedure Laterality Date  . Tubal ligation     No family history on file. History  Substance Use Topics  . Smoking status: Current Every Day Smoker -- 0.50 packs/day    Types: Cigarettes  . Smokeless tobacco: Never Used  . Alcohol Use: No   OB History   Grav Para Term Preterm Abortions TAB SAB Ect Mult Living                 Review of Systems  All other systems reviewed and are negative.    Allergies  Clindamycin/lincomycin  Home Medications   Current Outpatient Rx  Name  Route  Sig  Dispense  Refill  . gabapentin (NEURONTIN) 400 MG capsule   Oral   Take 400 mg by mouth daily.         Marland Kitchen gabapentin (NEURONTIN) 800 MG tablet   Oral   Take 400 mg by mouth.           BP 102/69  Pulse 88  Temp(Src) 97.7 F (36.5 C) (Oral)  Resp 18  Ht 5\' 7"  (1.702 m)  Wt 164 lb (74.39 kg)  BMI 25.68 kg/m2  SpO2 100% Physical Exam  Nursing note and vitals reviewed. Constitutional: She is oriented to person, place, and time. She appears well-developed and well-nourished.  HENT:  Head: Normocephalic and atraumatic.  Right Ear: External ear normal.  Left Ear: External ear normal.  Nose: Nose normal.  Mouth/Throat: Oropharynx is clear and moist.  Eyes: Conjunctivae and EOM are normal. Pupils are equal, round, and reactive to  light.  Neck: Normal range of motion. Neck supple.  Cardiovascular: Normal rate, regular rhythm, normal heart sounds and intact distal pulses.   Pulmonary/Chest: Effort normal and breath sounds normal.  Abdominal: Soft. Bowel sounds are normal. There is tenderness.  Mild ttp epigastrium and luq  Musculoskeletal: Normal range of motion.  Neurological: She is alert and oriented to person, place, and time. She has normal reflexes.  Skin: Skin is warm and dry.  Psychiatric: She has a normal mood and affect. Her behavior is normal. Judgment and thought content normal.    ED Course   Procedures (including critical care time)  Labs Reviewed  URINALYSIS, ROUTINE W REFLEX MICROSCOPIC - Abnormal; Notable for the following:    Color, Urine AMBER (*)    APPearance CLOUDY (*)    Bilirubin Urine SMALL (*)    Ketones, ur 15 (*)    Protein, ur 30 (*)    Nitrite POSITIVE (*)    Leukocytes, UA LARGE (*)    All other components within normal limits  COMPREHENSIVE METABOLIC PANEL - Abnormal; Notable for the following:    Creatinine, Ser 1.40 (*)    Calcium 11.8 (*)    GFR calc non Af Amer 42 (*)  GFR calc Af Amer 49 (*)    All other components within normal limits  URINE MICROSCOPIC-ADD ON - Abnormal; Notable for the following:    Squamous Epithelial / LPF FEW (*)    Bacteria, UA MANY (*)    All other components within normal limits  URINE CULTURE  CBC WITH DIFFERENTIAL  LIPASE, BLOOD   Ct Abdomen Pelvis Wo Contrast  07/24/2013   *RADIOLOGY REPORT*  Clinical Data: Left flank pain with nausea and vomiting.  CT ABDOMEN AND PELVIS WITHOUT CONTRAST  Technique:  Multidetector CT imaging of the abdomen and pelvis was performed following the standard protocol without intravenous contrast.  Comparison: None.  Findings: The patient has numerous cysts throughout the liver. Parenchyma is otherwise normal.  The common bile duct is dilated to a diameter of 8 mm and on image number 25 of series 2 there is  suggestion of possible filling defects in the common bile duct.  Is the patient's bilirubin normal?  The spleen, pancreas, and adrenal glands are normal.  Right kidney is normal.  There is a 12 mm low density lesion in the anterior aspect the midportion of the left kidney, statistically a cyst but this is indeterminate on this unenhanced scan.  There are no renal or ureteral calculi.  No hydronephrosis.  Bladder appears normal.  Uterus and ovaries are normal.  The bowel appears normal.  The cecum lies low in the pelvis adjacent to the rectum.  Appendix is normal as is the terminal ileum.  No free air or free fluid in the abdomen or pelvis.  No acute osseous abnormality.  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  IMPRESSION:  1.  Slight dilatation of the common bile duct with a possible stone in the common bile duct. 2.  No other significant abnormality of the abdomen or pelvis. Numerous benign cysts in the liver. 3.  Indeterminate low density lesion in the midportion of the left kidney, statistically most likely a cyst.  This could be assessed by ultrasound of the kidney on an elective basis.   Original Report Authenticated By: Francene Boyers, M.D.   No diagnosis found.  MDM  1- luq pain- work up c.w. Cbd, no elevated lft or lipase.  Fu discussed with Dr.Perry and he states she can call office for f/u.  Discussed with patient and advised regarding return precautions.  2 uti- patient treated here with rocephin 3- hypercalcemia- plan f/u for recheck and further work up with pmd   Hilario Quarry, MD 07/24/13 2359

## 2013-07-24 NOTE — ED Notes (Signed)
Pt states abd pain seems to be worsened by certain foods after eating.

## 2013-07-26 LAB — URINE CULTURE

## 2013-07-27 ENCOUNTER — Telehealth (HOSPITAL_COMMUNITY): Payer: Self-pay | Admitting: Emergency Medicine

## 2013-07-27 NOTE — ED Notes (Signed)
Post ED Visit - Positive Culture Follow-up  Culture report reviewed by antimicrobial stewardship pharmacist: []  Gwendolyn Page, Pharm.D., BCPS []  Gwendolyn Page, Pharm.D., BCPS [x]  Gwendolyn Page, Pharm.D., BCPS []  Gwendolyn Page, [address].D., BCPS, AAHIVP []  Gwendolyn Page, Pharm.D., BCPS, AAHIVP  Positive urine culture Treated with Keflex, organism sensitive to the same and no further patient follow-up is required at this time.  Gwendolyn Page 07/27/2013, 3:07 PM

## 2013-10-08 ENCOUNTER — Encounter (HOSPITAL_BASED_OUTPATIENT_CLINIC_OR_DEPARTMENT_OTHER): Payer: Self-pay | Admitting: Emergency Medicine

## 2013-10-08 ENCOUNTER — Emergency Department (HOSPITAL_BASED_OUTPATIENT_CLINIC_OR_DEPARTMENT_OTHER)
Admission: EM | Admit: 2013-10-08 | Discharge: 2013-10-08 | Disposition: A | Discharge status: Home / Self Care | Payer: BC Managed Care – PPO | Attending: Emergency Medicine | Admitting: Emergency Medicine

## 2013-10-08 DIAGNOSIS — Z79899 Other long term (current) drug therapy: Secondary | ICD-10-CM | POA: Insufficient documentation

## 2013-10-08 DIAGNOSIS — M5432 Sciatica, left side: Secondary | ICD-10-CM

## 2013-10-08 DIAGNOSIS — Z8659 Personal history of other mental and behavioral disorders: Secondary | ICD-10-CM | POA: Insufficient documentation

## 2013-10-08 DIAGNOSIS — Z8719 Personal history of other diseases of the digestive system: Secondary | ICD-10-CM | POA: Insufficient documentation

## 2013-10-08 DIAGNOSIS — Z792 Long term (current) use of antibiotics: Secondary | ICD-10-CM | POA: Insufficient documentation

## 2013-10-08 DIAGNOSIS — F17200 Nicotine dependence, unspecified, uncomplicated: Secondary | ICD-10-CM | POA: Insufficient documentation

## 2013-10-08 DIAGNOSIS — M5430 Sciatica, unspecified side: Secondary | ICD-10-CM | POA: Insufficient documentation

## 2013-10-08 LAB — URINALYSIS, ROUTINE W REFLEX MICROSCOPIC
Bilirubin Urine: NEGATIVE
Leukocytes, UA: NEGATIVE
Nitrite: NEGATIVE
Specific Gravity, Urine: 1.014 (ref 1.005–1.030)
pH: 6 (ref 5.0–8.0)

## 2013-10-08 MED ORDER — KETOROLAC TROMETHAMINE 60 MG/2ML IM SOLN
60.0000 mg | Freq: Once | INTRAMUSCULAR | Status: AC
  Administered 2013-10-08: 60 mg via INTRAMUSCULAR
  Filled 2013-10-08: qty 2

## 2013-10-08 MED ORDER — CYCLOBENZAPRINE HCL 10 MG PO TABS
10.0000 mg | ORAL_TABLET | Freq: Once | ORAL | Status: DC
  Filled 2013-10-08: qty 1

## 2013-10-08 MED ORDER — HYDROCODONE-ACETAMINOPHEN 5-325 MG PO TABS: 1.0000 | ORAL_TABLET | Freq: Four times a day (QID) | ORAL | Status: DC | PRN

## 2013-10-08 MED ORDER — CYCLOBENZAPRINE HCL 10 MG PO TABS: 10.0000 mg | ORAL_TABLET | Freq: Two times a day (BID) | ORAL | Status: DC | PRN

## 2013-10-08 NOTE — ED Notes (Signed)
C/o left hip pain x 3 weeks-denies injury-pain worse this am-steady gait int traige

## 2013-10-08 NOTE — ED Provider Notes (Signed)
CSN: [id number]     Arrival date & time 10/08/13  1121 History   First MD Initiated Contact with Patient 10/08/13 1158     Chief Complaint  Patient presents with  . Hip Pain   (Consider location/radiation/quality/duration/timing/severity/associated sxs/prior Treatment) HPI Comments: Noticed left lower back pain at work 3 weeks ago.   Patient is a 52 y.o. female presenting with hip pain. The history is provided by the patient.  Hip Pain This is a new problem. The current episode started more than 1 week ago. The problem occurs constantly. The problem has been gradually worsening. Pertinent negatives include no abdominal pain and no shortness of breath. The symptoms are aggravated by bending, walking and twisting. Nothing relieves the symptoms. She has tried nothing for the symptoms.    Past Medical History  Diagnosis Date  . Mood swings   . Insomnia   . Pancreatitis    Past Surgical History  Procedure Laterality Date  . Tubal ligation     No family history on file. History  Substance Use Topics  . Smoking status: Current Every Day Smoker -- 0.50 packs/day    Types: Cigarettes  . Smokeless tobacco: Never Used  . Alcohol Use: No   OB History   Grav Para Term Preterm Abortions TAB SAB Ect Mult Living                 Review of Systems  Constitutional: Negative for fever.  Respiratory: Negative for cough and shortness of breath.   Gastrointestinal: Negative for vomiting and abdominal pain.  All other systems reviewed and are negative.    Allergies  Clindamycin/lincomycin  Home Medications   Current Outpatient Rx  Name  Route  Sig  Dispense  Refill  . cephALEXin (KEFLEX) 500 MG capsule   Oral   Take 1 capsule (500 mg total) by mouth 3 (three) times daily.   20 capsule   0   . gabapentin (NEURONTIN) 400 MG capsule   Oral   Take 400 mg by mouth daily.         Marland Kitchen gabapentin (NEURONTIN) 800 MG tablet   Oral   Take 400 mg by mouth.          Marland Kitchen  HYDROcodone-acetaminophen (NORCO/VICODIN) 5-325 MG per tablet   Oral   Take 1 tablet by mouth every 6 (six) hours as needed for moderate pain.   15 tablet   0   . ondansetron (ZOFRAN ODT) 8 MG disintegrating tablet   Oral   Take 1 tablet (8 mg total) by mouth every 8 (eight) hours as needed for nausea.   20 tablet   0   . oxyCODONE-acetaminophen (PERCOCET/ROXICET) 5-325 MG per tablet   Oral   Take 2 tablets by mouth every 4 (four) hours as needed for pain.   15 tablet   0    BP 124/63  Pulse 80  Temp(Src) 97.8 F (36.6 C) (Oral)  Resp 16  Ht 5\' 7"  (1.702 m)  Wt 160 lb (72.576 kg)  BMI 25.05 kg/m2  SpO2 98% Physical Exam  Nursing note and vitals reviewed. Constitutional: She is oriented to person, place, and time. She appears well-developed and well-nourished. No distress.  HENT:  Head: Normocephalic and atraumatic.  Eyes: EOM are normal. Pupils are equal, round, and reactive to light.  Neck: Normal range of motion. Neck supple.  Cardiovascular: Normal rate and regular rhythm.  Exam reveals no friction rub.   No murmur heard. Pulmonary/Chest: Effort normal and breath  sounds normal. No respiratory distress. She has no wheezes. She has no rales.  Abdominal: Soft. She exhibits no distension. There is no tenderness. There is no rebound.  Musculoskeletal: Normal range of motion. She exhibits no edema.       Lumbar back: She exhibits tenderness (L flank).  Neurological: She is alert and oriented to person, place, and time.  Skin: She is not diaphoretic.    ED Course  Procedures (including critical care time) Labs Review Labs Reviewed - No data to display Imaging Review No results found.  EKG Interpretation   None       MDM   1. Sciatica neuralgia, left    48F here with L sided flank pain. Radiating down into leg. Denies bladder incontinence, urinary retention, bowel incontinence. Patient denies numbness, tingling in leg. No vomiting, fever, diarrhea. Pain  started when working, worse with activity, bending over. Patient's exam benign except for L lower lumbar pain. Likely sciatica with radiation to L posterior thigh. Will give toradol and flexeril, sent home with Rx for vicodin. UA sent for urinary urgency. No dysuria, but is having some frequency.    Dagmar Hait, MD 10/08/13 [phone]

## 2013-10-08 NOTE — Discharge Instructions (Signed)
Sciatica °Sciatica is pain, weakness, numbness, or tingling along the path of the sciatic nerve. The nerve starts in the lower back and runs down the back of each leg. The nerve controls the muscles in the lower leg and in the back of the knee, while also providing sensation to the back of the thigh, lower leg, and the sole of your foot. Sciatica is a symptom of another medical condition. For instance, nerve damage or certain conditions, such as a herniated disk or bone spur on the spine, pinch or put pressure on the sciatic nerve. This causes the pain, weakness, or other sensations normally associated with sciatica. Generally, sciatica only affects one side of the body. °CAUSES  °· Herniated or slipped disc. °· Degenerative disk disease. °· A pain disorder involving the narrow muscle in the buttocks (piriformis syndrome). °· Pelvic injury or fracture. °· Pregnancy. °· Tumor (rare). °SYMPTOMS  °Symptoms can vary from mild to very severe. The symptoms usually travel from the low back to the buttocks and down the back of the leg. Symptoms can include: °· Mild tingling or dull aches in the lower back, leg, or hip. °· Numbness in the back of the calf or sole of the foot. °· Burning sensations in the lower back, leg, or hip. °· Sharp pains in the lower back, leg, or hip. °· Leg weakness. °· Severe back pain inhibiting movement. °These symptoms may get worse with coughing, sneezing, laughing, or prolonged sitting or standing. Also, being overweight may worsen symptoms. °DIAGNOSIS  °Your caregiver will perform a physical exam to look for common symptoms of sciatica. He or she may ask you to do certain movements or activities that would trigger sciatic nerve pain. Other tests may be performed to find the cause of the sciatica. These may include: °· Blood tests. °· X-rays. °· Imaging tests, such as an MRI or CT scan. °TREATMENT  °Treatment is directed at the cause of the sciatic pain. Sometimes, treatment is not necessary  and the pain and discomfort goes away on its own. If treatment is needed, your caregiver may suggest: °· Over-the-counter medicines to relieve pain. °· Prescription medicines, such as anti-inflammatory medicine, muscle relaxants, or narcotics. °· Applying heat or ice to the painful area. °· Steroid injections to lessen pain, irritation, and inflammation around the nerve. °· Reducing activity during periods of pain. °· Exercising and stretching to strengthen your abdomen and improve flexibility of your spine. Your caregiver may suggest losing weight if the extra weight makes the back pain worse. °· Physical therapy. °· Surgery to eliminate what is pressing or pinching the nerve, such as a bone spur or part of a herniated disk. °HOME CARE INSTRUCTIONS  °· Only take over-the-counter or prescription medicines for pain or discomfort as directed by your caregiver. °· Apply ice to the affected area for 20 minutes, 3 4 times a day for the first 48 72 hours. Then try heat in the same way. °· Exercise, stretch, or perform your usual activities if these do not aggravate your pain. °· Attend physical therapy sessions as directed by your caregiver. °· Keep all follow-up appointments as directed by your caregiver. °· Do not wear high heels or shoes that do not provide proper support. °· Check your mattress to see if it is too soft. A firm mattress may lessen your pain and discomfort. °SEEK IMMEDIATE MEDICAL CARE IF:  °· You lose control of your bowel or bladder (incontinence). °· You have increasing weakness in the lower back,   pelvis, buttocks, or legs. °· You have redness or swelling of your back. °· You have a burning sensation when you urinate. °· You have pain that gets worse when you lie down or awakens you at night. °· Your pain is worse than you have experienced in the past. °· Your pain is lasting longer than 4 weeks. °· You are suddenly losing weight without reason. °MAKE SURE YOU: °· Understand these  instructions. °· Will watch your condition. °· Will get help right away if you are not doing well or get worse. °Document Released: 11/14/2001 Document Revised: 05/21/2012 Document Reviewed: 03/31/2012 °[employer]® Patient Information ©2014 [employer]. ° °

## 2013-11-03 ENCOUNTER — Emergency Department (HOSPITAL_BASED_OUTPATIENT_CLINIC_OR_DEPARTMENT_OTHER)
Admission: EM | Admit: 2013-11-03 | Discharge: 2013-11-03 | Disposition: A | Discharge status: Home / Self Care | Payer: BC Managed Care – PPO | Attending: Emergency Medicine | Admitting: Emergency Medicine

## 2013-11-03 ENCOUNTER — Encounter (HOSPITAL_BASED_OUTPATIENT_CLINIC_OR_DEPARTMENT_OTHER): Payer: Self-pay | Admitting: Emergency Medicine

## 2013-11-03 DIAGNOSIS — Z79899 Other long term (current) drug therapy: Secondary | ICD-10-CM | POA: Insufficient documentation

## 2013-11-03 DIAGNOSIS — T7840XA Allergy, unspecified, initial encounter: Secondary | ICD-10-CM

## 2013-11-03 DIAGNOSIS — F17200 Nicotine dependence, unspecified, uncomplicated: Secondary | ICD-10-CM | POA: Insufficient documentation

## 2013-11-03 DIAGNOSIS — R21 Rash and other nonspecific skin eruption: Secondary | ICD-10-CM | POA: Insufficient documentation

## 2013-11-03 DIAGNOSIS — Z8719 Personal history of other diseases of the digestive system: Secondary | ICD-10-CM | POA: Insufficient documentation

## 2013-11-03 DIAGNOSIS — Z792 Long term (current) use of antibiotics: Secondary | ICD-10-CM | POA: Insufficient documentation

## 2013-11-03 DIAGNOSIS — Z8659 Personal history of other mental and behavioral disorders: Secondary | ICD-10-CM | POA: Insufficient documentation

## 2013-11-03 MED ORDER — PREDNISONE 20 MG PO TABS: 40.0000 mg | ORAL_TABLET | Freq: Every day | ORAL | Status: DC

## 2013-11-03 MED ORDER — DIPHENHYDRAMINE HCL 25 MG PO CAPS
25.0000 mg | ORAL_CAPSULE | Freq: Once | ORAL | Status: AC
  Administered 2013-11-03: 25 mg via ORAL
  Filled 2013-11-03: qty 1

## 2013-11-03 MED ORDER — FAMOTIDINE 20 MG PO TABS: 20.0000 mg | ORAL_TABLET | Freq: Two times a day (BID) | ORAL | Status: DC

## 2013-11-03 MED ORDER — PREDNISONE 50 MG PO TABS
60.0000 mg | ORAL_TABLET | Freq: Once | ORAL | Status: AC
  Administered 2013-11-03: 60 mg via ORAL
  Filled 2013-11-03 (×2): qty 1

## 2013-11-03 MED ORDER — FAMOTIDINE 20 MG PO TABS
40.0000 mg | ORAL_TABLET | Freq: Once | ORAL | Status: AC
  Administered 2013-11-03: 40 mg via ORAL
  Filled 2013-11-03: qty 2

## 2013-11-03 NOTE — ED Provider Notes (Signed)
CSN: [id number]     Arrival date & time 11/03/13  1159 History   First MD Initiated Contact with Patient 11/03/13 1215     Chief Complaint  Patient presents with  . Facial Swelling   (Consider location/radiation/quality/duration/timing/severity/associated sxs/prior Treatment) Patient is a 52 y.o. female presenting with rash. The history is provided by the patient.  Rash Location:  Shoulder/arm, leg and face Facial rash location:  L eyelid and R eyelid Shoulder/arm rash location:  L hand, R hand, R forearm and L forearm Leg rash location:  L upper leg and R upper leg Quality: burning, painful and redness   Quality: not blistering, not draining, not peeling, not scaling and not weeping   Pain details:    Quality:  Aching and burning   Severity:  Moderate   Onset quality:  Gradual   Duration:  24 hours   Timing:  Constant   Progression:  Worsening Severity:  Moderate Onset quality:  Gradual Timing:  Constant Progression:  Worsening Chronicity:  New Context comment:  Did use a new cleaner at work last night and also ate a chicken gumbo Relieved by:  Nothing Worsened by:  Nothing tried Ineffective treatments:  None tried Associated symptoms: no diarrhea, no fever, no headaches, no nausea, no shortness of breath, no sore throat, no throat swelling and no tongue swelling     Past Medical History  Diagnosis Date  . Mood swings   . Insomnia   . Pancreatitis    Past Surgical History  Procedure Laterality Date  . Tubal ligation     No family history on file. History  Substance Use Topics  . Smoking status: Current Every Day Smoker -- 0.50 packs/day    Types: Cigarettes  . Smokeless tobacco: Never Used  . Alcohol Use: No   OB History   Grav Para Term Preterm Abortions TAB SAB Ect Mult Living                 Review of Systems  Constitutional: Negative for fever.  HENT: Negative for sore throat.   Respiratory: Negative for shortness of breath.   Gastrointestinal:  Negative for nausea and diarrhea.  Skin: Positive for rash.  Neurological: Negative for headaches.  All other systems reviewed and are negative.    Allergies  Clindamycin/lincomycin  Home Medications   Current Outpatient Rx  Name  Route  Sig  Dispense  Refill  . cephALEXin (KEFLEX) 500 MG capsule   Oral   Take 1 capsule (500 mg total) by mouth 3 (three) times daily.   20 capsule   0   . cyclobenzaprine (FLEXERIL) 10 MG tablet   Oral   Take 1 tablet (10 mg total) by mouth 2 (two) times daily as needed for muscle spasms.   20 tablet   0   . gabapentin (NEURONTIN) 400 MG capsule   Oral   Take 400 mg by mouth daily.         Marland Kitchen gabapentin (NEURONTIN) 800 MG tablet   Oral   Take 400 mg by mouth.          Marland Kitchen HYDROcodone-acetaminophen (NORCO/VICODIN) 5-325 MG per tablet   Oral   Take 1 tablet by mouth every 6 (six) hours as needed for moderate pain.   15 tablet   0   . ondansetron (ZOFRAN ODT) 8 MG disintegrating tablet   Oral   Take 1 tablet (8 mg total) by mouth every 8 (eight) hours as needed for nausea.   20  tablet   0   . oxyCODONE-acetaminophen (PERCOCET/ROXICET) 5-325 MG per tablet   Oral   Take 2 tablets by mouth every 4 (four) hours as needed for pain.   15 tablet   0    BP 116/85  Pulse 98  Temp(Src) 97.6 F (36.4 C) (Oral)  Resp 16  Ht 5\' 7"  (1.702 m)  Wt 164 lb (74.39 kg)  BMI 25.68 kg/m2  SpO2 99% Physical Exam  Nursing note and vitals reviewed. Constitutional: She is oriented to person, place, and time. She appears well-developed and well-nourished. No distress.  HENT:  Head: Normocephalic and atraumatic.  Mild erythema around the eyes with some mild swelling  Eyes: EOM are normal. Pupils are equal, round, and reactive to light.  Cardiovascular: Normal rate.   Pulmonary/Chest: Effort normal.  Abdominal: Soft.  Neurological: She is alert and oriented to person, place, and time.  Skin: Skin is warm and dry. Rash noted. Rash is macular.  There is erythema.     Blanching tender rash in the areas shown.  No blistering, skin sloughing or pustules present  Psychiatric: She has a normal mood and affect.    ED Course  Procedures (including critical care time) Labs Review Labs Reviewed - No data to display Imaging Review No results found.  EKG Interpretation   None       MDM   1. Allergic reaction, initial encounter     Patient presents with a rash that started last night the bilateral hands and inner thighs. Today it spread to her face. She denies any mouth, lip, oral or genital involvement. She describes the rash as painful like a sunburn. On exam she has erythematous swollen areas to the bilateral inner thighs and some lower arms and and redness around her eyes. It blanches and there is no vesicles or pustules present. Feel most likely this is an allergic reaction to an unknown substance. Patient takes no sulfa medications, Dilantin or other indications that cause Stevens-Johnson's. She currently has no oral involvement and no sloughing of the skin. Will treat with prednisone Pepcid and Benadryl and have patient return if any of the above occur    Gwyneth Sprout, MD 11/03/13 1252

## 2013-11-03 NOTE — ED Notes (Signed)
Pt states she woke this am with generalized swelling and redness-states is painful to touch-no resp distress-was sent from work

## 2013-12-20 ENCOUNTER — Emergency Department (HOSPITAL_BASED_OUTPATIENT_CLINIC_OR_DEPARTMENT_OTHER)
Admission: EM | Admit: 2013-12-20 | Discharge: 2013-12-20 | Disposition: A | Discharge status: Home / Self Care | Payer: No Typology Code available for payment source | Attending: Emergency Medicine | Admitting: Emergency Medicine

## 2013-12-20 ENCOUNTER — Encounter (HOSPITAL_BASED_OUTPATIENT_CLINIC_OR_DEPARTMENT_OTHER): Payer: Self-pay | Admitting: Emergency Medicine

## 2013-12-20 DIAGNOSIS — F17200 Nicotine dependence, unspecified, uncomplicated: Secondary | ICD-10-CM | POA: Insufficient documentation

## 2013-12-20 DIAGNOSIS — IMO0002 Reserved for concepts with insufficient information to code with codable children: Secondary | ICD-10-CM | POA: Insufficient documentation

## 2013-12-20 DIAGNOSIS — N390 Urinary tract infection, site not specified: Secondary | ICD-10-CM | POA: Insufficient documentation

## 2013-12-20 DIAGNOSIS — Z79899 Other long term (current) drug therapy: Secondary | ICD-10-CM | POA: Insufficient documentation

## 2013-12-20 DIAGNOSIS — M5430 Sciatica, unspecified side: Secondary | ICD-10-CM | POA: Insufficient documentation

## 2013-12-20 DIAGNOSIS — R209 Unspecified disturbances of skin sensation: Secondary | ICD-10-CM | POA: Insufficient documentation

## 2013-12-20 DIAGNOSIS — Z8659 Personal history of other mental and behavioral disorders: Secondary | ICD-10-CM | POA: Insufficient documentation

## 2013-12-20 DIAGNOSIS — Z792 Long term (current) use of antibiotics: Secondary | ICD-10-CM | POA: Insufficient documentation

## 2013-12-20 DIAGNOSIS — Z8719 Personal history of other diseases of the digestive system: Secondary | ICD-10-CM | POA: Insufficient documentation

## 2013-12-20 HISTORY — DX: Dorsalgia, unspecified: M54.9

## 2013-12-20 LAB — URINALYSIS, ROUTINE W REFLEX MICROSCOPIC
GLUCOSE, UA: NEGATIVE mg/dL
HGB URINE DIPSTICK: NEGATIVE
KETONES UR: NEGATIVE mg/dL
Nitrite: NEGATIVE
PH: 6 (ref 5.0–8.0)
PROTEIN: NEGATIVE mg/dL
Specific Gravity, Urine: 1.03 (ref 1.005–1.030)
Urobilinogen, UA: 0.2 mg/dL (ref 0.0–1.0)

## 2013-12-20 LAB — URINE MICROSCOPIC-ADD ON

## 2013-12-20 MED ORDER — IBUPROFEN 800 MG PO TABS: 800.0000 mg | ORAL_TABLET | Freq: Three times a day (TID) | ORAL | Status: DC

## 2013-12-20 MED ORDER — IBUPROFEN 800 MG PO TABS
800.0000 mg | ORAL_TABLET | Freq: Once | ORAL | Status: AC
  Administered 2013-12-20: 800 mg via ORAL
  Filled 2013-12-20: qty 1

## 2013-12-20 MED ORDER — CEPHALEXIN 500 MG PO CAPS: 500.0000 mg | ORAL_CAPSULE | Freq: Four times a day (QID) | ORAL | Status: DC

## 2013-12-20 MED ORDER — HYDROCODONE-ACETAMINOPHEN 5-325 MG PO TABS: 2.0000 | ORAL_TABLET | ORAL | Status: DC | PRN

## 2013-12-20 NOTE — ED Notes (Signed)
C/o left hip and low back pain since Wednesday. Denies new injury

## 2013-12-20 NOTE — ED Provider Notes (Signed)
CSN: [id number]     Arrival date & time 12/20/13  1824 History  This chart was scribed for Glynn Octave, MD by Blanchard Kelch, ED Scribe. The patient was seen in room MH04/MH04. Patient's care was started at 7:53 PM.      Chief Complaint  Patient presents with  . Back Pain    Patient is a 53 y.o. female presenting with back pain. The history is provided by the patient. No language interpreter was used.  Back Pain   HPI Comments: Gwendolyn Page is a 53 y.o. female who presents to the Emergency Department complaining of constant lower back pain that began three days ago. The pain radiates to her left hip and down the entire left leg. She reports intermittent numbness in the leg. 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 She believes this may have exacerbated the pain. She has been using Goody powder without relief. She denies dysuria, hematuria, bowel or bladder incontinence, abdominal pain or weakness in her extremities. She states that she has been seen for similar pain in the past and was diagnosed with sciatic nerve irritation. She denies any surgeries on her back.  Past Medical History  Diagnosis Date  . Mood swings   . Insomnia   . Pancreatitis   . Back pain    Past Surgical History  Procedure Laterality Date  . Tubal ligation     No family history on file. History  Substance Use Topics  . Smoking status: Current Every Day Smoker -- 0.50 packs/day    Types: Cigarettes  . Smokeless tobacco: Never Used  . Alcohol Use: No   OB History   Grav Para Term Preterm Abortions TAB SAB Ect Mult Living                 Review of Systems A complete 10 system review of systems was obtained and all systems are negative except as noted in the HPI and PMH.    Allergies  Clindamycin/lincomycin  Home Medications   Current Outpatient Rx  Name  Route  Sig  Dispense  Refill  . cephALEXin (KEFLEX) 500  MG capsule   Oral   Take 1 capsule (500 mg total) by mouth 3 (three) times daily.   20 capsule   0   . cephALEXin (KEFLEX) 500 MG capsule   Oral   Take 1 capsule (500 mg total) by mouth 4 (four) times daily.   40 capsule   0   . cyclobenzaprine (FLEXERIL) 10 MG tablet   Oral   Take 1 tablet (10 mg total) by mouth 2 (two) times daily as needed for muscle spasms.   20 tablet   0   . famotidine (PEPCID) 20 MG tablet   Oral   Take 1 tablet (20 mg total) by mouth 2 (two) times daily.   10 tablet   0   . gabapentin (NEURONTIN) 400 MG capsule   Oral   Take 400 mg by mouth 2 (two) times daily.          Marland Kitchen HYDROcodone-acetaminophen (NORCO/VICODIN) 5-325 MG per tablet   Oral   Take 1 tablet by mouth every 6 (six) hours as needed for moderate pain.   15 tablet   0   . HYDROcodone-acetaminophen (NORCO/VICODIN) 5-325 MG per tablet   Oral   Take 2 tablets by mouth every 4 (four) hours as needed.   10  tablet   0   . ibuprofen (ADVIL,MOTRIN) 800 MG tablet   Oral   Take 1 tablet (800 mg total) by mouth 3 (three) times daily.   21 tablet   0   . ondansetron (ZOFRAN ODT) 8 MG disintegrating tablet   Oral   Take 1 tablet (8 mg total) by mouth every 8 (eight) hours as needed for nausea.   20 tablet   0   . oxyCODONE-acetaminophen (PERCOCET/ROXICET) 5-325 MG per tablet   Oral   Take 2 tablets by mouth every 4 (four) hours as needed for pain.   15 tablet   0   . predniSONE (DELTASONE) 20 MG tablet   Oral   Take 2 tablets (40 mg total) by mouth daily.   10 tablet   0    Triage Vitals: BP 123/74  Pulse 77  Temp(Src) 98 F (36.7 C) (Oral)  Resp 18  Ht 5\' 7"  (1.702 m)  Wt 164 lb (74.39 kg)  BMI 25.68 kg/m2  SpO2 100%  Physical Exam  Nursing note and vitals reviewed. Constitutional: She is oriented to person, place, and time. She appears well-developed and well-nourished. No distress.  HENT:  Head: Normocephalic and atraumatic.  Eyes: EOM are normal.  Neck: Neck  supple. No tracheal deviation present.  Cardiovascular: Normal rate, regular rhythm and normal heart sounds.   No murmur heard. Pulmonary/Chest: Effort normal and breath sounds normal. No respiratory distress. She has no wheezes. She has no rales.  Abdominal: Soft. There is no tenderness. There is no rebound and no guarding.  Musculoskeletal: Normal range of motion. She exhibits tenderness.  Left lumbar paraspinal pain. No midline pain. 5/5 strength in bilateral lower extremities. Ankle plantar and dorsiflexion intact. Great toe extension intact bilaterally. +2 DP and PT pulses. +2 patellar reflexes bilaterally. Normal gait.  Neurological: She is alert and oriented to person, place, and time.  Skin: Skin is warm and dry.  Psychiatric: She has a normal mood and affect. Her behavior is normal.    ED Course  Procedures (including critical care time)  DIAGNOSTIC STUDIES: Oxygen Saturation is 100% on room air, normal by my interpretation.    COORDINATION OF CARE: 7:57 PM -Will discharge with medication for pain and note Patient verbalizes understanding and agrees with treatment plan.    Labs Review Labs Reviewed  URINALYSIS, ROUTINE W REFLEX MICROSCOPIC - Abnormal; Notable for the following:    Bilirubin Urine SMALL (*)    Leukocytes, UA SMALL (*)    All other components within normal limits  URINE MICROSCOPIC-ADD ON - Abnormal; Notable for the following:    Squamous Epithelial / LPF FEW (*)    Bacteria, UA MANY (*)    All other components within normal limits  URINE CULTURE   Imaging Review No results found.  EKG Interpretation   None       MDM   1. Sciatica   2. Urinary tract infection    Left-sided low back pain it radiates down the left leg for the past 4 days. Denies any new injury. She is starting to work a new job where she does some lifting. No focal weakness, numbness or tingling. Similar to psychiatric that she's had in the past. Denies any urinary  symptoms.  On exam patient has no focal weakness, numbness, or tingling. No bowel or bladder incontinence.  No evidence of cauda equina or cord compression.  We'll treat for right sciatica. We'll also treat for questionable UTI. Followup with PCP.  I personally performed the services described in this documentation, which was scribed in my presence. The recorded information has been reviewed and is accurate.   Glynn Octave, MD 12/20/13 [phone]

## 2013-12-20 NOTE — Discharge Instructions (Signed)
Sciatica °Sciatica is pain, weakness, numbness, or tingling along the path of the sciatic nerve. The nerve starts in the lower back and runs down the back of each leg. The nerve controls the muscles in the lower leg and in the back of the knee, while also providing sensation to the back of the thigh, lower leg, and the sole of your foot. Sciatica is a symptom of another medical condition. For instance, nerve damage or certain conditions, such as a herniated disk or bone spur on the spine, pinch or put pressure on the sciatic nerve. This causes the pain, weakness, or other sensations normally associated with sciatica. Generally, sciatica only affects one side of the body. °CAUSES  °· Herniated or slipped disc. °· Degenerative disk disease. °· A pain disorder involving the narrow muscle in the buttocks (piriformis syndrome). °· Pelvic injury or fracture. °· Pregnancy. °· Tumor (rare). °SYMPTOMS  °Symptoms can vary from mild to very severe. The symptoms usually travel from the low back to the buttocks and down the back of the leg. Symptoms can include: °· Mild tingling or dull aches in the lower back, leg, or hip. °· Numbness in the back of the calf or sole of the foot. °· Burning sensations in the lower back, leg, or hip. °· Sharp pains in the lower back, leg, or hip. °· Leg weakness. °· Severe back pain inhibiting movement. °These symptoms may get worse with coughing, sneezing, laughing, or prolonged sitting or standing. Also, being overweight may worsen symptoms. °DIAGNOSIS  °Your caregiver will perform a physical exam to look for common symptoms of sciatica. He or she may ask you to do certain movements or activities that would trigger sciatic nerve pain. Other tests may be performed to find the cause of the sciatica. These may include: °· Blood tests. °· X-rays. °· Imaging tests, such as an MRI or CT scan. °TREATMENT  °Treatment is directed at the cause of the sciatic pain. Sometimes, treatment is not necessary  and the pain and discomfort goes away on its own. If treatment is needed, your caregiver may suggest: °· Over-the-counter medicines to relieve pain. °· Prescription medicines, such as anti-inflammatory medicine, muscle relaxants, or narcotics. °· Applying heat or ice to the painful area. °· Steroid injections to lessen pain, irritation, and inflammation around the nerve. °· Reducing activity during periods of pain. °· Exercising and stretching to strengthen your abdomen and improve flexibility of your spine. Your caregiver may suggest losing weight if the extra weight makes the back pain worse. °· Physical therapy. °· Surgery to eliminate what is pressing or pinching the nerve, such as a bone spur or part of a herniated disk. °HOME CARE INSTRUCTIONS  °· Only take over-the-counter or prescription medicines for pain or discomfort as directed by your caregiver. °· Apply ice to the affected area for 20 minutes, 3 4 times a day for the first 48 72 hours. Then try heat in the same way. °· Exercise, stretch, or perform your usual activities if these do not aggravate your pain. °· Attend physical therapy sessions as directed by your caregiver. °· Keep all follow-up appointments as directed by your caregiver. °· Do not wear high heels or shoes that do not provide proper support. °· Check your mattress to see if it is too soft. A firm mattress may lessen your pain and discomfort. °SEEK IMMEDIATE MEDICAL CARE IF:  °· You lose control of your bowel or bladder (incontinence). °· You have increasing weakness in the lower back,   pelvis, buttocks, or legs. °· You have redness or swelling of your back. °· You have a burning sensation when you urinate. °· You have pain that gets worse when you lie down or awakens you at night. °· Your pain is worse than you have experienced in the past. °· Your pain is lasting longer than 4 weeks. °· You are suddenly losing weight without reason. °MAKE SURE YOU: °· Understand these  instructions. °· Will watch your condition. °· Will get help right away if you are not doing well or get worse. °Document Released: 11/14/2001 Document Revised: 05/21/2012 Document Reviewed: 03/31/2012 °[employer]® Patient Information ©2014 [employer]. ° °

## 2013-12-22 LAB — URINE CULTURE
COLONY COUNT: NO GROWTH
CULTURE: NO GROWTH

## 2014-01-12 ENCOUNTER — Emergency Department (HOSPITAL_BASED_OUTPATIENT_CLINIC_OR_DEPARTMENT_OTHER)
Admission: EM | Admit: 2014-01-12 | Discharge: 2014-01-12 | Disposition: A | Discharge status: Home / Self Care | Payer: No Typology Code available for payment source | Attending: Emergency Medicine | Admitting: Emergency Medicine

## 2014-01-12 ENCOUNTER — Encounter (HOSPITAL_BASED_OUTPATIENT_CLINIC_OR_DEPARTMENT_OTHER): Payer: Self-pay | Admitting: Emergency Medicine

## 2014-01-12 ENCOUNTER — Emergency Department (HOSPITAL_BASED_OUTPATIENT_CLINIC_OR_DEPARTMENT_OTHER): Payer: No Typology Code available for payment source

## 2014-01-12 DIAGNOSIS — Z8659 Personal history of other mental and behavioral disorders: Secondary | ICD-10-CM | POA: Insufficient documentation

## 2014-01-12 DIAGNOSIS — Z87440 Personal history of urinary (tract) infections: Secondary | ICD-10-CM | POA: Insufficient documentation

## 2014-01-12 DIAGNOSIS — M545 Low back pain, unspecified: Secondary | ICD-10-CM | POA: Insufficient documentation

## 2014-01-12 DIAGNOSIS — Z8669 Personal history of other diseases of the nervous system and sense organs: Secondary | ICD-10-CM | POA: Insufficient documentation

## 2014-01-12 DIAGNOSIS — M549 Dorsalgia, unspecified: Secondary | ICD-10-CM

## 2014-01-12 DIAGNOSIS — Z79899 Other long term (current) drug therapy: Secondary | ICD-10-CM | POA: Insufficient documentation

## 2014-01-12 DIAGNOSIS — F17200 Nicotine dependence, unspecified, uncomplicated: Secondary | ICD-10-CM | POA: Insufficient documentation

## 2014-01-12 DIAGNOSIS — R112 Nausea with vomiting, unspecified: Secondary | ICD-10-CM | POA: Insufficient documentation

## 2014-01-12 DIAGNOSIS — Z8719 Personal history of other diseases of the digestive system: Secondary | ICD-10-CM | POA: Insufficient documentation

## 2014-01-12 LAB — CBC WITH DIFFERENTIAL/PLATELET
BASOS ABS: 0 10*3/uL (ref 0.0–0.1)
BASOS PCT: 0 % (ref 0–1)
Eosinophils Absolute: 0.1 10*3/uL (ref 0.0–0.7)
Eosinophils Relative: 1 % (ref 0–5)
HCT: 43.2 % (ref 36.0–46.0)
HEMOGLOBIN: 14.5 g/dL (ref 12.0–15.0)
LYMPHS PCT: 44 % (ref 12–46)
Lymphs Abs: 3.4 10*3/uL (ref 0.7–4.0)
MCH: 32.7 pg (ref 26.0–34.0)
MCHC: 33.6 g/dL (ref 30.0–36.0)
MCV: 97.3 fL (ref 78.0–100.0)
MONOS PCT: 9 % (ref 3–12)
Monocytes Absolute: 0.7 10*3/uL (ref 0.1–1.0)
NEUTROS ABS: 3.5 10*3/uL (ref 1.7–7.7)
NEUTROS PCT: 46 % (ref 43–77)
Platelets: 190 10*3/uL (ref 150–400)
RBC: 4.44 MIL/uL (ref 3.87–5.11)
RDW: 12 % (ref 11.5–15.5)
WBC: 7.7 10*3/uL (ref 4.0–10.5)

## 2014-01-12 LAB — BASIC METABOLIC PANEL
BUN: 17 mg/dL (ref 6–23)
CHLORIDE: 106 meq/L (ref 96–112)
CO2: 24 meq/L (ref 19–32)
Calcium: 10.7 mg/dL — ABNORMAL HIGH (ref 8.4–10.5)
Creatinine, Ser: 1 mg/dL (ref 0.50–1.10)
GFR calc non Af Amer: 63 mL/min — ABNORMAL LOW (ref >90)
GFR, EST AFRICAN AMERICAN: 73 mL/min — AB (ref >90)
Glucose, Bld: 89 mg/dL (ref 70–99)
POTASSIUM: 4.7 meq/L (ref 3.7–5.3)
Sodium: 143 mEq/L (ref 137–147)

## 2014-01-12 LAB — URINE MICROSCOPIC-ADD ON

## 2014-01-12 LAB — URINALYSIS, ROUTINE W REFLEX MICROSCOPIC
Bilirubin Urine: NEGATIVE
GLUCOSE, UA: NEGATIVE mg/dL
HGB URINE DIPSTICK: NEGATIVE
KETONES UR: NEGATIVE mg/dL
Nitrite: NEGATIVE
PH: 6.5 (ref 5.0–8.0)
PROTEIN: NEGATIVE mg/dL
Specific Gravity, Urine: 1.009 (ref 1.005–1.030)
Urobilinogen, UA: 0.2 mg/dL (ref 0.0–1.0)

## 2014-01-12 MED ORDER — ONDANSETRON 4 MG PO TBDP
4.0000 mg | ORAL_TABLET | Freq: Once | ORAL | Status: AC
  Administered 2014-01-12: 4 mg via ORAL
  Filled 2014-01-12: qty 1

## 2014-01-12 MED ORDER — TRAMADOL HCL 50 MG PO TABS: 50.0000 mg | ORAL_TABLET | Freq: Four times a day (QID) | ORAL | Status: DC | PRN

## 2014-01-12 MED ORDER — OXYCODONE-ACETAMINOPHEN 5-325 MG PO TABS
2.0000 | ORAL_TABLET | Freq: Once | ORAL | Status: AC
  Administered 2014-01-12: 2 via ORAL
  Filled 2014-01-12: qty 2

## 2014-01-12 NOTE — ED Notes (Signed)
Pt was seen and diagnosed with UTI given Keflex to take pt reports she took all of the meds and didn't miss any doses however feels like she isnt any better continues to have low back pain as when she has kidney infection. Pt also is having pain behind right scapula pt reports hurts with movement and/or inspiration

## 2014-01-12 NOTE — ED Notes (Signed)
Patient transported to CT 

## 2014-01-12 NOTE — ED Provider Notes (Signed)
CSN: [id number]     Arrival date & time 01/12/14  1213 History   First MD Initiated Contact with Patient 01/12/14 1303     Chief Complaint  Patient presents with  . unresolved uti   . pain behind right shoulder blade with deep breath      (Consider location/radiation/quality/duration/timing/severity/associated sxs/prior Treatment) HPI Comments: Patient presents with right-sided back pain. She states she's had a history of frequent UTIs and is still similar. She denies any abdominal pain. She denies any urinary symptoms or hematuria. She denies any vaginal bleeding or discharge. She does say the pain is been constant over the last month. It is worse with movement but is otherwise constant. She denies any radiation down her legs. There is no numbness or weakness in her legs. She denies any known injury to her back. She does have associated nausea and vomiting periodically. She denies any fevers or chills.   Past Medical History  Diagnosis Date  . Mood swings   . Insomnia   . Pancreatitis   . Back pain    Past Surgical History  Procedure Laterality Date  . Tubal ligation     History reviewed. No pertinent family history. History  Substance Use Topics  . Smoking status: Current Every Day Smoker -- 0.50 packs/day    Types: Cigarettes  . Smokeless tobacco: Never Used  . Alcohol Use: No   OB History   Grav Para Term Preterm Abortions TAB SAB Ect Mult Living                 Review of Systems  Constitutional: Negative for fever, chills, diaphoresis and fatigue.  HENT: Negative for congestion, rhinorrhea and sneezing.   Eyes: Negative.   Respiratory: Negative for cough, chest tightness and shortness of breath.   Cardiovascular: Negative for chest pain and leg swelling.  Gastrointestinal: Positive for nausea and vomiting. Negative for abdominal pain, diarrhea and blood in stool.  Genitourinary: Negative for frequency, hematuria, flank pain and difficulty urinating.  Musculoskeletal:  Positive for back pain. Negative for arthralgias.  Skin: Negative for rash.  Neurological: Negative for dizziness, speech difficulty, weakness, numbness and headaches.      Allergies  Clindamycin/lincomycin  Home Medications   Current Outpatient Rx  Name  Route  Sig  Dispense  Refill  . gabapentin (NEURONTIN) 400 MG capsule   Oral   Take 400 mg by mouth 2 (two) times daily.          . cephALEXin (KEFLEX) 500 MG capsule   Oral   Take 1 capsule (500 mg total) by mouth 3 (three) times daily.   20 capsule   0   . cephALEXin (KEFLEX) 500 MG capsule   Oral   Take 1 capsule (500 mg total) by mouth 4 (four) times daily.   40 capsule   0   . cyclobenzaprine (FLEXERIL) 10 MG tablet   Oral   Take 1 tablet (10 mg total) by mouth 2 (two) times daily as needed for muscle spasms.   20 tablet   0   . famotidine (PEPCID) 20 MG tablet   Oral   Take 1 tablet (20 mg total) by mouth 2 (two) times daily.   10 tablet   0   . HYDROcodone-acetaminophen (NORCO/VICODIN) 5-325 MG per tablet   Oral   Take 1 tablet by mouth every 6 (six) hours as needed for moderate pain.   15 tablet   0   . HYDROcodone-acetaminophen (NORCO/VICODIN) 5-325 MG per tablet  Oral   Take 2 tablets by mouth every 4 (four) hours as needed.   10 tablet   0   . ibuprofen (ADVIL,MOTRIN) 800 MG tablet   Oral   Take 1 tablet (800 mg total) by mouth 3 (three) times daily.   21 tablet   0   . ondansetron (ZOFRAN ODT) 8 MG disintegrating tablet   Oral   Take 1 tablet (8 mg total) by mouth every 8 (eight) hours as needed for nausea.   20 tablet   0   . oxyCODONE-acetaminophen (PERCOCET/ROXICET) 5-325 MG per tablet   Oral   Take 2 tablets by mouth every 4 (four) hours as needed for pain.   15 tablet   0   . predniSONE (DELTASONE) 20 MG tablet   Oral   Take 2 tablets (40 mg total) by mouth daily.   10 tablet   0   . traMADol (ULTRAM) 50 MG tablet   Oral   Take 1 tablet (50 mg total) by mouth every 6  (six) hours as needed.   15 tablet   0    BP 135/74  Pulse 74  Temp(Src) 98.9 F (37.2 C)  Resp 16  SpO2 99% Physical Exam  Constitutional: She is oriented to person, place, and time. She appears well-developed and well-nourished.  HENT:  Head: Normocephalic and atraumatic.  Eyes: Pupils are equal, round, and reactive to light.  Neck: Normal range of motion. Neck supple.  Cardiovascular: Normal rate, regular rhythm and normal heart sounds.   Pulmonary/Chest: Effort normal and breath sounds normal. No respiratory distress. She has no wheezes. She has no rales. She exhibits no tenderness.  Abdominal: Soft. Bowel sounds are normal. There is no tenderness. There is no rebound and no guarding.  Musculoskeletal: Normal range of motion. She exhibits no edema.  Positive tenderness to the right lower back. There's no spinal tenderness. Negative straight leg raise bilaterally.  Lymphadenopathy:    She has no cervical adenopathy.  Neurological: She is alert and oriented to person, place, and time.  She has normal sensation in the legs. Normal motor function in the legs. Pedal pulses are intact.  Skin: Skin is warm and dry. No rash noted.  Psychiatric: She has a normal mood and affect.    ED Course  Procedures (including critical care time) Labs Review Results for orders placed during the hospital encounter of 01/12/14  URINALYSIS, ROUTINE W REFLEX MICROSCOPIC      Result Value Range   Color, Urine YELLOW  YELLOW   APPearance CLEAR  CLEAR   Specific Gravity, Urine 1.009  1.005 - 1.030   pH 6.5  5.0 - 8.0   Glucose, UA NEGATIVE  NEGATIVE mg/dL   Hgb urine dipstick NEGATIVE  NEGATIVE   Bilirubin Urine NEGATIVE  NEGATIVE   Ketones, ur NEGATIVE  NEGATIVE mg/dL   Protein, ur NEGATIVE  NEGATIVE mg/dL   Urobilinogen, UA 0.2  0.0 - 1.0 mg/dL   Nitrite NEGATIVE  NEGATIVE   Leukocytes, UA TRACE (*) NEGATIVE  URINE MICROSCOPIC-ADD ON      Result Value Range   Squamous Epithelial / LPF RARE   RARE   WBC, UA 0-2  <3 WBC/hpf   RBC / HPF 0-2  <3 RBC/hpf   Bacteria, UA RARE  RARE  CBC WITH DIFFERENTIAL      Result Value Range   WBC 7.7  4.0 - 10.5 K/uL   RBC 4.44  3.87 - 5.11 MIL/uL   Hemoglobin 14.5  12.0 - 15.0 g/dL   HCT 81.1  91.4 - 78.2 %   MCV 97.3  78.0 - 100.0 fL   MCH 32.7  26.0 - 34.0 pg   MCHC 33.6  30.0 - 36.0 g/dL   RDW 95.6  21.3 - 08.6 %   Platelets 190  150 - 400 K/uL   Neutrophils Relative % 46  43 - 77 %   Neutro Abs 3.5  1.7 - 7.7 K/uL   Lymphocytes Relative 44  12 - 46 %   Lymphs Abs 3.4  0.7 - 4.0 K/uL   Monocytes Relative 9  3 - 12 %   Monocytes Absolute 0.7  0.1 - 1.0 K/uL   Eosinophils Relative 1  0 - 5 %   Eosinophils Absolute 0.1  0.0 - 0.7 K/uL   Basophils Relative 0  0 - 1 %   Basophils Absolute 0.0  0.0 - 0.1 K/uL  BASIC METABOLIC PANEL      Result Value Range   Sodium 143  137 - 147 mEq/L   Potassium 4.7  3.7 - 5.3 mEq/L   Chloride 106  96 - 112 mEq/L   CO2 24  19 - 32 mEq/L   Glucose, Bld 89  70 - 99 mg/dL   BUN 17  6 - 23 mg/dL   Creatinine, Ser 5.78  0.50 - 1.10 mg/dL   Calcium 46.9 (*) 8.4 - 10.5 mg/dL   GFR calc non Af Amer 63 (*) >90 mL/min   GFR calc Af Amer 73 (*) >90 mL/min   Ct Abdomen Pelvis Wo Contrast  01/12/2014   CLINICAL DATA:  Right flank pain.  EXAM: CT ABDOMEN AND PELVIS WITHOUT CONTRAST  TECHNIQUE: Multidetector CT imaging of the abdomen and pelvis was performed following the standard protocol without intravenous contrast.  COMPARISON:  CT scan of July 24, 2013.  FINDINGS: Mild degenerative disc disease is noted at L4-5. Visualized lung bases appear normal.  Multiple cysts are noted throughout both hepatic lobes which are stable compared to prior exam. No gallstones are noted. Stable dilatation of common bile duct is noted which is unchanged compared to prior exam. The spleen and pancreas appear normal. Adrenal glands appear normal. No hydronephrosis or renal obstruction is noted. No renal or ureteral calculi are  noted. Stable left renal cyst is noted. Stool is noted throughout the colon. There is no evidence of bowel obstruction. The appendix appears normal. No abnormal fluid collection is noted. Uterus and urinary bladder appear normal. No definite adenopathy is noted.  IMPRESSION: Stable hepatic and left renal cysts. Stable dilatation of common bile duct. No other significant abnormality seen throughout the abdomen or pelvis.   Electronically Signed   By: Roque Lias M.D.   On: 01/12/2014 14:00    Imaging Review Ct Abdomen Pelvis Wo Contrast  01/12/2014   CLINICAL DATA:  Right flank pain.  EXAM: CT ABDOMEN AND PELVIS WITHOUT CONTRAST  TECHNIQUE: Multidetector CT imaging of the abdomen and pelvis was performed following the standard protocol without intravenous contrast.  COMPARISON:  CT scan of July 24, 2013.  FINDINGS: Mild degenerative disc disease is noted at L4-5. Visualized lung bases appear normal.  Multiple cysts are noted throughout both hepatic lobes which are stable compared to prior exam. No gallstones are noted. Stable dilatation of common bile duct is noted which is unchanged compared to prior exam. The spleen and pancreas appear normal. Adrenal glands appear normal. No hydronephrosis or renal obstruction is noted. No renal or  ureteral calculi are noted. Stable left renal cyst is noted. Stool is noted throughout the colon. There is no evidence of bowel obstruction. The appendix appears normal. No abnormal fluid collection is noted. Uterus and urinary bladder appear normal. No definite adenopathy is noted.  IMPRESSION: Stable hepatic and left renal cysts. Stable dilatation of common bile duct. No other significant abnormality seen throughout the abdomen or pelvis.   Electronically Signed   By: Roque Lias M.D.   On: 01/12/2014 14:00    EKG Interpretation   None       MDM   Final diagnoses:  Back pain    Patient presents with back pain. She states it feels like her past UTIs I don't see  any evidence of a UTI. On her last visit she had a urine culture that was negative. I did do a CT to assess for kidney stones but this was negative. Given this I feel that his pain is likely musculoskeletal in origin. She's given a perception for Ultram to use for pain and encouraged to followup with her primary care physician. She was given a Facilities manager for outpatient referral.    Rolan Bucco, MD 01/12/14 [phone]

## 2014-01-12 NOTE — ED Notes (Signed)
Patient asked to change into gown & provide clean catch urine sample. 

## 2014-01-12 NOTE — Discharge Instructions (Signed)
Back Pain, Adult °Low back pain is very common. About 1 in 5 people have back pain. The cause of low back pain is rarely dangerous. The pain often gets better over time. About half of people with a sudden onset of back pain feel better in just 2 weeks. About 8 in 10 people feel better by 6 weeks.  °CAUSES °Some common causes of back pain include: °· Strain of the muscles or ligaments supporting the spine. °· Wear and tear (degeneration) of the spinal discs. °· Arthritis. °· Direct injury to the back. °DIAGNOSIS °Most of the time, the direct cause of low back pain is not known. However, back pain can be treated effectively even when the exact cause of the pain is unknown. Answering your caregiver's questions about your overall health and symptoms is one of the most accurate ways to make sure the cause of your pain is not dangerous. If your caregiver needs more information, he or she may order lab work or imaging tests (X-rays or MRIs). However, even if imaging tests show changes in your back, this usually does not require surgery. °HOME CARE INSTRUCTIONS °For many people, back pain returns. Since low back pain is rarely dangerous, it is often a condition that people can learn to manage on their own.  °· Remain active. It is stressful on the back to sit or stand in one place. Do not sit, drive, or stand in one place for more than 30 minutes at a time. Take short walks on level surfaces as soon as pain allows. Try to increase the length of time you walk each day. °· Do not stay in bed. Resting more than 1 or 2 days can delay your recovery. °· Do not avoid exercise or work. Your body is made to move. It is not dangerous to be active, even though your back may hurt. Your back will likely heal faster if you return to being active before your pain is gone. °· Pay attention to your body when you  bend and lift. Many people have less discomfort when lifting if they bend their knees, keep the load close to their bodies, and  avoid twisting. Often, the most comfortable positions are those that put less stress on your recovering back. °· Find a comfortable position to sleep. Use a firm mattress and lie on your side with your knees slightly bent. If you lie on your back, put a pillow under your knees. °· Only take over-the-counter or prescription medicines as directed by your caregiver. Over-the-counter medicines to reduce pain and inflammation are often the most helpful. Your caregiver may prescribe muscle relaxant drugs. These medicines help dull your pain so you can more quickly return to your normal activities and healthy exercise. °· Put ice on the injured area. °· Put ice in a plastic bag. °· Place a towel between your skin and the bag. °· Leave the ice on for 15-20 minutes, 03-04 times a day for the first 2 to 3 days. After that, ice and heat may be alternated to reduce pain and spasms. °· Ask your caregiver about trying back exercises and gentle massage. This may be of some benefit. °· Avoid feeling anxious or stressed. Stress increases muscle tension and can worsen back pain. It is important to recognize when you are anxious or stressed and learn ways to manage it. Exercise is a great option. °SEEK MEDICAL CARE IF: °· You have pain that is not relieved with rest or medicine. °· You have pain that does not improve in 1 week. °· You have new symptoms. °· You are generally not feeling well. °SEEK   IMMEDIATE MEDICAL CARE IF:  °· You have pain that radiates from your back into your legs. °· You develop new bowel or bladder control problems. °· You have unusual weakness or numbness in your arms or legs. °· You develop nausea or vomiting. °· You develop abdominal pain. °· You feel faint. °Document Released: 11/20/2005 Document Revised: 05/21/2012 Document Reviewed: 04/10/2011 °ExitCare® Patient Information ©2014 [employer]. ° ° °Emergency Department Resource Guide °1) Find a Doctor and Pay Out of Pocket °Although you won't have to find  out who is covered by your insurance plan, it is a good idea to ask around and get recommendations. You will then need to call the office and see if the doctor you have chosen will accept you as a new patient and what types of options they offer for patients who are self-pay. Some doctors offer discounts or will set up payment plans for their patients who do not have insurance, but you will need to ask so you aren't surprised when you get to your appointment. ° °2) Contact Your Local Health Department °Not all health departments have doctors that can see patients for sick visits, but many do, so it is worth a call to see if yours does. If you don't know where your local health department is, you can check in your phone book. The CDC also has a tool to help you locate your state's health department, and many state websites also have listings of all of their local health departments. ° °3) Find a Walk-in Clinic °If your illness is not likely to be very severe or complicated, you may want to try a walk in clinic. These are popping up all over the country in pharmacies, drugstores, and shopping centers. They're usually staffed by nurse practitioners or physician assistants that have been trained to treat common illnesses and complaints. They're usually fairly quick and inexpensive. However, if you have serious medical issues or chronic medical problems, these are probably not your best option. ° °No Primary Care Doctor: °- Call [facility] at  832-8000 - they can help you locate a primary care doctor that  accepts your insurance, provides certain services, etc. °- Physician Referral Service- 1-800-533-3463 ° °Chronic Pain Problems: °Organization         Address  Phone   Notes  °[facility]  (336) 297-2271 Patients need to be referred by their primary care doctor.  ° °Medication Assistance: °Organization         Address  Phone   Notes  °[place] [facility] [address] °[place], [place] [address] (336) 641-8030 --Must be a resident of [place] °-- Must have NO insurance coverage whatsoever (no Medicaid/ Medicare, etc.) °-- The pt. MUST have a primary care doctor that directs their care regularly and follows them in the community °  °[facility]  (866) 331-1348   °[employer]  (888) 892-1162   ° °Agencies that provide inexpensive medical care: °Organization         Address  Phone   Notes  °[facility]  (336) 832-8035   °[facility]    (336) 832-7272   °[facility] [address] °[place], [place] [address] (336) 832-4777   °[facility] [address], °[place] (336) 271-4999   °[facility]    (336) 373-0678   °[facility]    (336) 272-1050   °[facility] °   [address], [place] Phone:  (336) 832-4444, Fax:  (336) 832-4440 Hours of Operation:  9 am - 6 pm, M-F.  Also accepts Medicaid/Medicare and self-pay.  °[facility] ° [address], [place] Phone: (336) 832-3150, Fax: (336) [phone]. Hours of Operation:  8:30 am - 5:30 pm, M-F.  Also accepts Medicaid and self-pay.  °[facility] [place] [address], [place] Phone: (336) 878-6027   °[facility] [address], [place], [place] (336)723-1848, Ext. 123 Mondays & Thursdays: 7-9 AM.  First 15 patients are seen on a first come, first serve basis. °  ° °Medicaid-accepting [place] Providers: ° °Organization         Address  Phone   Notes  °[facility] [address], [place] (336) 641-2100 Also accepts self-pay patients.  °[facility] [address], [place] ° (336) 856-9996   °[facility] [address], [place] (336) 288-8857   °[facility] [address] [place] [address], [place] (336) 299-7000   °Veita Bland  [address], [place]  ° (336) 373-1557 Only accepts Combine Access Medicaid patients after they have their name applied to their card.  ° °Self-Pay (no insurance) in [place]: ° °Organization         Address  Phone   Notes  °Sickle Cell Patients, [facility] [address], [place] (336) 832-1970   °[facility] [address], [place] (336) 832-4400   °[facility] ° [address], [place] (336) 992-4800   °[facility]/Dr. Osei-Bonsu ° [address] [place] [address], [place] or [address], [place] (336) [phone] Phone number for both [place] and [place] locations is the same.  °[facility] [address], [place] (336) 299-0000   °[facility] [address] [place] [address], [place] or [address] (336) 852-7530 °(336) 878-2260   °[facility] [address], [place] (336) 350-1642, phone; (336) 294-5005, fax Sees patients 1st and 3rd Saturday of every month.  Must not qualify for public or private insurance (i.e. Medicaid, Medicare, [place] Health Choice, Veterans' Benefits) • Household income should be no more than 200% of the poverty level •The clinic cannot treat you if you are pregnant or think you are pregnant • Sexually transmitted diseases are not treated at the clinic.  ° ° °Dental Care: °Organization         Address  Phone  Notes  °[facility] [address],  (336) 641-6152 Accepts children up to age 21 who are enrolled in Medicaid or [place] Health Choice; pregnant women with a Medicaid card; and children who have applied for Medicaid or [place] Health Choice, but were declined, whose parents can pay a reduced fee at time of service.  °[facility] [place]  [address], [place] (336) 641-7733 Accepts children up to age 21  who are enrolled in Medicaid or [place] Health Choice; pregnant women with a Medicaid card; and children who have applied for Medicaid or  Health Choice, but were declined, whose parents can pay a reduced fee at time of service.  °[facility] ° [address],  (336) 641-4533   Patients are seen by appointment only. Walk-ins are not accepted. [facility] will see patients 18 years of age and older. °Monday - Tuesday (8am-5pm) °Most Wednesdays (8:30-5pm) °$30 per visit, cash only  °[facility] ° [address], [place] (336) 641-4533 Patients are seen by appointment only. Walk-ins are not accepted. [facility] will see patients 18 years of age and older. °One Wednesday Evening (Monthly: Volunteer Based).  $30 per visit, cash only  °[facility]  (919) 537-3737 for adults; Children under age 4, call Graduate Pediatric Dentistry at ([phone]) [phone]. Children aged 4-14, please call (919) [phone] to request a pediatric application. ° Dental services are provided in all areas of dental care including fillings, crowns and bridges, complete and partial dentures, implants, gum treatment, root canals, and extractions. Preventive care is also provided. Treatment is provided to both adults and children. °Patients are selected via a lottery and there is often a waiting list. °  °[facility] [address], °[place] ° (336) 763-8833 [email or web address] °  °[facility] [address], [place], [place] (336)723-1848, Ext. 123 Second and Fourth Thursday of each month, opens at 6:30 AM; Clinic ends at 9 AM.  Patients are seen on a first-come first-served basis, and a limited number are seen during each clinic.  ° °[facility] ° [address], [place], [place] (336) 723-7904   Eligibility Requirements °You must have lived in [place], [place], or [place] counties for at least the last three months. °   You cannot be eligible for state or federal sponsored healthcare insurance, including Veterans Administration, Medicaid, or Medicare. °  You generally cannot be eligible for healthcare insurance through your employer.  °  How to apply: °Eligibility screenings are held every Tuesday and Wednesday afternoon from 1:00 pm until 4:00 pm. You do not need an appointment for the interview!  °[facility] [address], [place], [place] 336-631-2330   °[facility]  336-342-8273   °[facility]  336-703-3100   °[facility]  336-570-6415   ° °Behavioral Health Resources in the Community: °Intensive Outpatient Programs °Organization         Address  Phone  Notes  °[facility] [address], [place], [place] 336-878-6098   °[facility] [address], [place], [place] 336-832-9800   °[facility] [address], [place], [place] ° 336-882-2125   °[facility] [address],  °[place], [place] 1-800-853-5163 or 336-641-4981   °Substance Abuse Resources °Organization         Address  Phone  Notes  °[facility]  336-882-2125   °[facility]  336-784-9470   °[facility]  336-285-9073   °[facility]  336-845-3988   °Residential & Outpatient Substance Abuse Program  1-800-659-3381   °Psychological Services °Organization         Address  Phone  Notes  °[facility]  336- 832-9600   °[facility]  336- 378-7881   °[facility] [address], [place] 1-800-853-5163 or 336-641-4981   ° °Mobile Crisis Teams °Organization         Address  Phone  Notes  °[facility], Mobile Crisis Care Unit  1-877-626-1772   °Assertive °[facility] ° [address] [place], [place] 336-834-9664   °Sharon DeEsch [address] °  [place] 336-554-5454   ° °Self-Help/Support  Groups °Organization         Address  Phone             Notes  °[facility] [place] [facility] - variety of support groups  336- 373-1402 Call for more information  °Narcotics Anonymous (NA), [facility] [address], °[place]  2 meetings at this location  ° °Residential Treatment Programs °Organization         Address  Phone  Notes  °[facility] [address],    °[place]  1-866-801-8205   °[facility] ° [address], [place], [place] 704-293-8524   °[facility] [address], [place] 336-845-3988 Admissions: 8am-3pm M-F  °[facility] [address]    °[place], [place] 336-841-1104   °[facility] [address], [place], [place] 336-379-7146   °[facility] [address]  °[place], [place] 336-285-9073   °[facility] - Intensive Outpatient [address], [place], [place] 336-852-3033   °[facility] [address]  °[place], [place] 1-877-615-2722 or 336-784-9470   °[facility] [address] [place], [place] 336-227-7417 Accepts Medicaid  °[facility] [address]  ° [place] 1-800-659-3381 Substance Abuse/Addiction Treatment  ° °[place] Behavioral Health Resources °Organization         Address  Phone  Notes  °[facility]  (888) 581-9988   °Julie Brannon, PhD [address] [place], [place]   (336) 349-5553 or (336) 951-0000   °[facility]   [address] °[place], [place] (336) 349-4454   °[facility] [address], [place], [place] (336) 342-8316 Insurance/Medicaid/sponsorship through [facility]  °[facility] [address]                                    [place], [place] (336) 342-8316 Therapy/tele-psych/case  °[facility] [address]  ° [place],  (336) 349-2233    °Dr. Arfeen  (336) 349-4544   °[facility] [place]  [place]  [employer] [place] [facility] 1) [address], [place] °2) [address], [place] °3)  [address], [place] (336) 349-3220 °(336) 342-7768 ° °(336) 342-8140   °[place] [facility] (336) 342-1394 or (336) 342-3537 (After Hours)    ° ° ° °

## 2014-01-13 LAB — URINE CULTURE
CULTURE: NO GROWTH
Colony Count: NO GROWTH
Special Requests: NORMAL

## 2014-05-24 ENCOUNTER — Emergency Department (HOSPITAL_BASED_OUTPATIENT_CLINIC_OR_DEPARTMENT_OTHER)
Admission: EM | Admit: 2014-05-24 | Discharge: 2014-05-24 | Disposition: A | Discharge status: Home / Self Care | Payer: No Typology Code available for payment source | Attending: Emergency Medicine | Admitting: Emergency Medicine

## 2014-05-24 ENCOUNTER — Encounter (HOSPITAL_BASED_OUTPATIENT_CLINIC_OR_DEPARTMENT_OTHER): Payer: Self-pay | Admitting: Emergency Medicine

## 2014-05-24 ENCOUNTER — Emergency Department (HOSPITAL_BASED_OUTPATIENT_CLINIC_OR_DEPARTMENT_OTHER): Payer: No Typology Code available for payment source

## 2014-05-24 DIAGNOSIS — F17200 Nicotine dependence, unspecified, uncomplicated: Secondary | ICD-10-CM | POA: Insufficient documentation

## 2014-05-24 DIAGNOSIS — R519 Headache, unspecified: Secondary | ICD-10-CM

## 2014-05-24 DIAGNOSIS — R51 Headache: Secondary | ICD-10-CM | POA: Insufficient documentation

## 2014-05-24 DIAGNOSIS — Z79899 Other long term (current) drug therapy: Secondary | ICD-10-CM | POA: Insufficient documentation

## 2014-05-24 DIAGNOSIS — Z8719 Personal history of other diseases of the digestive system: Secondary | ICD-10-CM | POA: Insufficient documentation

## 2014-05-24 DIAGNOSIS — G4700 Insomnia, unspecified: Secondary | ICD-10-CM | POA: Insufficient documentation

## 2014-05-24 MED ORDER — METOCLOPRAMIDE HCL 5 MG/ML IJ SOLN
10.0000 mg | Freq: Once | INTRAMUSCULAR | Status: AC
  Administered 2014-05-24: 10 mg via INTRAVENOUS
  Filled 2014-05-24: qty 2

## 2014-05-24 MED ORDER — ONDANSETRON HCL 4 MG/2ML IJ SOLN
4.0000 mg | Freq: Once | INTRAMUSCULAR | Status: AC
  Administered 2014-05-24: 4 mg via INTRAVENOUS
  Filled 2014-05-24: qty 2

## 2014-05-24 MED ORDER — SODIUM CHLORIDE 0.9 % IV BOLUS (SEPSIS)
1000.0000 mL | Freq: Once | INTRAVENOUS | Status: AC
  Administered 2014-05-24: 1000 mL via INTRAVENOUS

## 2014-05-24 MED ORDER — KETOROLAC TROMETHAMINE 30 MG/ML IJ SOLN
30.0000 mg | Freq: Once | INTRAMUSCULAR | Status: AC
  Administered 2014-05-24: 30 mg via INTRAVENOUS
  Filled 2014-05-24: qty 1

## 2014-05-24 NOTE — Discharge Instructions (Signed)
General Headache Without Cause Follow up with your  Dr. Return to the ED if you develop new or worsening symptoms. A headache is pain or discomfort felt around the head or neck area. The specific cause of a headache may not be found. There are many causes and types of headaches. A few common ones are:  Tension headaches.  Migraine headaches.  Cluster headaches.  Chronic daily headaches. HOME CARE INSTRUCTIONS   Keep all follow-up appointments with your caregiver or any specialist referral.  Only take over-the-counter or prescription medicines for pain or discomfort as directed by your caregiver.  Lie down in a dark, quiet room when you have a headache.  Keep a headache journal to find out what may trigger your migraine headaches. For example, write down:  What you eat and drink.  How much sleep you get.  Any change to your diet or medicines.  Try massage or other relaxation techniques.  Put ice packs or heat on the head and neck. Use these 3 to 4 times per day for 15 to 20 minutes each time, or as needed.  Limit stress.  Sit up straight, and do not tense your muscles.  Quit smoking if you smoke.  Limit alcohol use.  Decrease the amount of caffeine you drink, or stop drinking caffeine.  Eat and sleep on a regular schedule.  Get 7 to 9 hours of sleep, or as recommended by your caregiver.  Keep lights dim if bright lights bother you and make your headaches worse. SEEK MEDICAL CARE IF:   You have problems with the medicines you were prescribed.  Your medicines are not working.  You have a change from the usual headache.  You have nausea or vomiting. SEEK IMMEDIATE MEDICAL CARE IF:   Your headache becomes severe.  You have a fever.  You have a stiff neck.  You have loss of vision.  You have muscular weakness or loss of muscle control.  You start losing your balance or have trouble walking.  You feel faint or pass out.  You have severe symptoms that  are different from your first symptoms. MAKE SURE YOU:   Understand these instructions.  Will watch your condition.  Will get help right away if you are not doing well or get worse. Document Released: 11/20/2005 Document Revised: 02/12/2012 Document Reviewed: 12/06/2011 [facility] Patient Information 2015 [employer] Ranch[employer]Care, MarylandLLC. This information is not intended to replace advice given to you by your health care provider. Make sure you discuss any questions you have with your health care provider.

## 2014-05-24 NOTE — ED Notes (Signed)
Pt offered water for po challenge.  Reports feeling better following meds and fluids.

## 2014-05-24 NOTE — ED Notes (Signed)
Reports HA x 2 weeks. Only relief when sleeping. Denies vomiting. Reports photosensitivity and nausea.

## 2014-05-24 NOTE — ED Notes (Signed)
Blanket offered.

## 2014-05-24 NOTE — ED Provider Notes (Signed)
CSN: [id number]     Arrival date & time 05/24/14  1516 History  This chart was scribed for Glynn OctaveStephen Ruthvik Barnaby, MD by Phillis HaggisGabriella Gaje, ED Scribe. This patient was seen in room MH11/MH11 and patient care was started at 3:30 PM.     Chief Complaint  Patient presents with  . Headache   The history is provided by the patient. No language interpreter was used.   HPI Comments: Gwendolyn Page is a 53 y.o. female who presents to the Emergency Department complaining of daily frontal headaches and bilateral ear pain onset two weeks ago. Patient states that the pain is gone when she wakes up, but will come back if she's been up for a while and worsens throughout the day. Patient states that the heat, light and noise makes the pain worse , but sleep makes it better. Patient reports associated dizziness, nausea and a loss of appetite.  Patient denies fever, vomiting, SOB, stomach pain, or chest pain. Patient denies a history of headaches. Patient states that she wears her hair back tightly most of the time, but the headaches are present even when she wears her hair down.  Patient reports having bronchitis in the past. Patient reports that she smokes daily and takes Neurontin for mood swings daily. Patient states that she lives with son, but he has shown no symptoms. Patient currently does not have a PCP.   She denies h/o heart or lung problems.    Past Medical History  Diagnosis Date  . Mood swings   . Insomnia   . Pancreatitis   . Back pain    Past Surgical History  Procedure Laterality Date  . Tubal ligation     No family history on file. History  Substance Use Topics  . Smoking status: Current Every Day Smoker -- 0.50 packs/day    Types: Cigarettes  . Smokeless tobacco: Never Used  . Alcohol Use: No   OB History   Grav Para Term Preterm Abortions TAB SAB Ect Mult Living                 Review of Systems A complete 10 system review of systems was obtained and all systems are negative except as  noted in the HPI and PMH.      Allergies  Clindamycin/lincomycin  Home Medications   Prior to Admission medications   Medication Sig Start Date End Date Taking? Authorizing Provider  gabapentin (NEURONTIN) 400 MG capsule Take 400 mg by mouth 2 (two) times daily.     Historical Provider, MD   BP 124/68  Pulse 81  Temp(Src) 97.5 F (36.4 C)  Resp 16  Ht 5\' 7"  (1.702 m)  Wt 154 lb (69.854 kg)  BMI 24.11 kg/m2  SpO2 100% Physical Exam  Nursing note and vitals reviewed. Constitutional: She is oriented to person, place, and time. She appears well-developed and well-nourished. No distress.  HENT:  Head: Normocephalic and atraumatic.  Mouth/Throat: Oropharynx is clear and moist. No oropharyngeal exudate.  No temporal artery tenderness  Eyes: Conjunctivae and EOM are normal. Pupils are equal, round, and reactive to light.  Neck: Normal range of motion. Neck supple.  No meningismus.  Cardiovascular: Normal rate, regular rhythm, normal heart sounds and intact distal pulses.   No murmur heard. Pulmonary/Chest: Effort normal and breath sounds normal. No respiratory distress.  Abdominal: Soft. There is no tenderness. There is no rebound and no guarding.  Musculoskeletal: Normal range of motion. She exhibits no edema and no tenderness.  Neurological:  She is alert and oriented to person, place, and time. No cranial nerve deficit. She exhibits normal muscle tone. Coordination normal.  No ataxia on finger to nose bilaterally. No pronator drift. 5/5 strength throughout. CN 2-12 intact. Negative Romberg. Equal grip strength. Sensation intact. Gait is normal.   Skin: Skin is warm.  Psychiatric: She has a normal mood and affect. Her behavior is normal.    ED Course  Procedures (including critical care time) DIAGNOSTIC STUDIES: Oxygen Saturation is 100% on room air, normal by my interpretation.    COORDINATION OF CARE: 3:35 PM-Discussed treatment plan which includes head CT, pain  medications and anti-emetics, and IV fluids with pt at bedside and pt agreed to plan.     Labs Review Labs Reviewed - No data to display  Imaging Review Ct Head Wo Contrast  05/24/2014   CLINICAL DATA:  53 year old female with frontal headache and nausea  EXAM: CT HEAD WITHOUT CONTRAST  TECHNIQUE: Contiguous axial images were obtained from the base of the skull through the vertex without intravenous contrast.  COMPARISON:  07/27/2009 and CT  FINDINGS: No intracranial abnormalities are identified, including mass lesion or mass effect, hydrocephalus, extra-axial fluid collection, midline shift, hemorrhage, or acute infarction.  The visualized bony calvarium is unremarkable. The visualized paranasal sinuses are clear.  IMPRESSION: Unremarkable noncontrast head CT   Electronically Signed   By: Laveda AbbeJeff  Hu M.D.   On: 05/24/2014 15:57     EKG Interpretation None      MDM   Final diagnoses:  Headache, unspecified headache type   Patient with one week history of frontal headache that resolves with sleeping and comes back when she was awake. Denies thunderclap onset. Denies any fever. Denies any photophobia or phonophobia. No focal weakness, numbness or tingling.  Neurological exam is nonfocal. Exam not consistent with meningitis, subarachnoid hemorrhage or temporal arteritis.   CT head negative. Headache improved with migraine cocktail. Patient tolerating by mouth. She is ambulatory. Her exam is unremarkable. Stable for follow up with PCP.  I personally performed the services described in this documentation, which was scribed in my presence. The recorded information has been reviewed and is accurate.    Glynn OctaveStephen Lisset Ketchem, MD 05/24/14 [phone]

## 2015-03-27 ENCOUNTER — Emergency Department (HOSPITAL_BASED_OUTPATIENT_CLINIC_OR_DEPARTMENT_OTHER): Payer: 59

## 2015-03-27 ENCOUNTER — Encounter (HOSPITAL_BASED_OUTPATIENT_CLINIC_OR_DEPARTMENT_OTHER): Payer: Self-pay

## 2015-03-27 ENCOUNTER — Emergency Department (HOSPITAL_BASED_OUTPATIENT_CLINIC_OR_DEPARTMENT_OTHER)
Admission: EM | Admit: 2015-03-27 | Discharge: 2015-03-27 | Disposition: A | Discharge status: Home / Self Care | Payer: 59 | Attending: Emergency Medicine | Admitting: Emergency Medicine

## 2015-03-27 DIAGNOSIS — Y9389 Activity, other specified: Secondary | ICD-10-CM | POA: Insufficient documentation

## 2015-03-27 DIAGNOSIS — Z8719 Personal history of other diseases of the digestive system: Secondary | ICD-10-CM | POA: Insufficient documentation

## 2015-03-27 DIAGNOSIS — Z8739 Personal history of other diseases of the musculoskeletal system and connective tissue: Secondary | ICD-10-CM | POA: Diagnosis not present

## 2015-03-27 DIAGNOSIS — Z8669 Personal history of other diseases of the nervous system and sense organs: Secondary | ICD-10-CM | POA: Diagnosis not present

## 2015-03-27 DIAGNOSIS — Y998 Other external cause status: Secondary | ICD-10-CM | POA: Insufficient documentation

## 2015-03-27 DIAGNOSIS — S0511XA Contusion of eyeball and orbital tissues, right eye, initial encounter: Secondary | ICD-10-CM

## 2015-03-27 DIAGNOSIS — Z8659 Personal history of other mental and behavioral disorders: Secondary | ICD-10-CM | POA: Insufficient documentation

## 2015-03-27 DIAGNOSIS — Y9289 Other specified places as the place of occurrence of the external cause: Secondary | ICD-10-CM | POA: Insufficient documentation

## 2015-03-27 DIAGNOSIS — S0011XA Contusion of right eyelid and periocular area, initial encounter: Secondary | ICD-10-CM | POA: Insufficient documentation

## 2015-03-27 DIAGNOSIS — Z79899 Other long term (current) drug therapy: Secondary | ICD-10-CM | POA: Diagnosis not present

## 2015-03-27 DIAGNOSIS — S0990XA Unspecified injury of head, initial encounter: Secondary | ICD-10-CM | POA: Diagnosis present

## 2015-03-27 DIAGNOSIS — Z87891 Personal history of nicotine dependence: Secondary | ICD-10-CM | POA: Diagnosis not present

## 2015-03-27 MED ORDER — HYDROCODONE-ACETAMINOPHEN 5-325 MG PO TABS: 2.0000 | ORAL_TABLET | ORAL | Status: DC | PRN
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
## 2015-03-27 NOTE — ED Notes (Signed)
Ring on left middle digit removed with ring cutter, given to pt to take home.
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
## 2015-03-27 NOTE — ED Notes (Signed)
Pt reminded of need for urine sample, reports unable to void still.

## 2015-03-27 NOTE — ED Notes (Signed)
Patient transported to X-ray and CT via stretcher per tech. 

## 2015-03-27 NOTE — ED Notes (Addendum)
MD at bedside. 
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
## 2015-09-26 ENCOUNTER — Emergency Department (HOSPITAL_BASED_OUTPATIENT_CLINIC_OR_DEPARTMENT_OTHER)
Admission: EM | Admit: 2015-09-26 | Discharge: 2015-09-26 | Disposition: A | Discharge status: Home / Self Care | Payer: 59 | Attending: Emergency Medicine | Admitting: Emergency Medicine

## 2015-09-26 ENCOUNTER — Encounter (HOSPITAL_BASED_OUTPATIENT_CLINIC_OR_DEPARTMENT_OTHER): Payer: Self-pay | Admitting: *Deleted

## 2015-09-26 DIAGNOSIS — Z8719 Personal history of other diseases of the digestive system: Secondary | ICD-10-CM | POA: Insufficient documentation

## 2015-09-26 DIAGNOSIS — Z79899 Other long term (current) drug therapy: Secondary | ICD-10-CM | POA: Insufficient documentation

## 2015-09-26 DIAGNOSIS — R519 Headache, unspecified: Secondary | ICD-10-CM

## 2015-09-26 DIAGNOSIS — G4700 Insomnia, unspecified: Secondary | ICD-10-CM | POA: Insufficient documentation

## 2015-09-26 DIAGNOSIS — Z87891 Personal history of nicotine dependence: Secondary | ICD-10-CM | POA: Diagnosis not present

## 2015-09-26 DIAGNOSIS — R51 Headache: Secondary | ICD-10-CM | POA: Insufficient documentation

## 2015-09-26 DIAGNOSIS — Z8659 Personal history of other mental and behavioral disorders: Secondary | ICD-10-CM | POA: Diagnosis not present

## 2015-09-26 MED ORDER — PROCHLORPERAZINE EDISYLATE 5 MG/ML IJ SOLN
10.0000 mg | Freq: Once | INTRAMUSCULAR | Status: DC
  Filled 2015-09-26: qty 2

## 2015-09-26 MED ORDER — SODIUM CHLORIDE 0.9 % IV SOLN
1000.0000 mL | Freq: Once | INTRAVENOUS | Status: AC
  Administered 2015-09-26: 1000 mL via INTRAVENOUS

## 2015-09-26 MED ORDER — ONDANSETRON HCL 4 MG/2ML IJ SOLN
4.0000 mg | Freq: Once | INTRAMUSCULAR | Status: AC
  Administered 2015-09-26: 4 mg via INTRAVENOUS
  Filled 2015-09-26: qty 2

## 2015-09-26 MED ORDER — KETOROLAC TROMETHAMINE 30 MG/ML IJ SOLN
30.0000 mg | Freq: Once | INTRAMUSCULAR | Status: AC
  Administered 2015-09-26: 30 mg via INTRAVENOUS
  Filled 2015-09-26: qty 1

## 2015-09-26 MED ORDER — DIPHENHYDRAMINE HCL 50 MG/ML IJ SOLN
25.0000 mg | Freq: Once | INTRAMUSCULAR | Status: AC
  Administered 2015-09-26: 25 mg via INTRAVENOUS
  Filled 2015-09-26: qty 1

## 2015-09-26 NOTE — ED Notes (Signed)
headache x 3 days.  Hx of same a couple years ago.  A/O x 4, denies photophobia, N/V

## 2015-09-26 NOTE — Discharge Instructions (Signed)
As we discussed, your exam today was not concerning for any serious/emergent causes of headache. However, if you develop high fever, facial droop, leg/arm weakness, or any other concerning symptoms, return to the emergency room immediately.  Please obtain all of your results from medical records or have your doctors office obtain the results - share them with your doctor - you should be seen at your doctors office in the next 2 days. Call today to arrange your follow up. Take the medications as prescribed. Please review all of the medicines and only take them if you do not have an allergy to them. Please be aware that if you are taking birth control pills, taking other prescriptions, ESPECIALLY ANTIBIOTICS may make the birth control ineffective - if this is the case, either do not engage in sexual activity or use alternative methods of birth control such as condoms until you have finished the medicine and your family doctor says it is OK to restart them. If you are on a blood thinner such as COUMADIN, be aware that any other medicine that you take may cause the coumadin to either work too much, or not enough - you should have your coumadin level rechecked in next 7 days if this is the case.  ?  It is also a possibility that you have an allergic reaction to any of the medicines that you have been prescribed - Everybody reacts differently to medications and while MOST people have no trouble with most medicines, you may have a reaction such as nausea, vomiting, rash, swelling, shortness of breath. If this is the case, please stop taking the medicine immediately and contact your physician.  ?  You should return to the ER if you develop severe or worsening symptoms.

## 2015-09-26 NOTE — ED Provider Notes (Signed)
CSN: [id number]     Arrival date & time 09/26/15  1532 History   First MD Initiated Contact with Patient 09/26/15 1617     Chief Complaint  Patient presents with  . Headache    HPI   Ms. Gwendolyn Page is a 54 y.o. female with h/o insomnia and back pain who presents to the ED for evaluation of headache. She states she has had a headache for the past 3 days. She describes it as starting in her frontal area and going back to her neck. States it waxes and wanes in severity. She has been trying Goody powder which usually takes care of her headaches but she states it hasn't relieved her headache completely like it usually does. She states usually she gets headaches a couple times a month and they resolve with Goody powder. Denies photophobia or phonophobia. States she was mildly nauseated yesterday with no emesis. Denies vertigo, hearing loss, vision changes. Denies fever, chills. Denies neck stiffness or muscle/joint pains. Denies weakness or speech difficulty.   Past Medical History  Diagnosis Date  . Mood swings (HCC)   . Insomnia   . Pancreatitis   . Back pain    Past Surgical History  Procedure Laterality Date  . Tubal ligation     History reviewed. No pertinent family history. Social History  Substance Use Topics  . Smoking status: Former Smoker -- 0.50 packs/day    Types: Cigarettes  . Smokeless tobacco: Never Used  . Alcohol Use: No   OB History    No data available     Review of Systems  All other systems reviewed and are negative.     Allergies  Clindamycin/lincomycin  Home Medications   Prior to Admission medications   Medication Sig Start Date End Date Taking? Authorizing Provider  estrogens conjugated, synthetic A, (CENESTIN) 0.3 MG tablet Take 0.3 mg by mouth daily.    Historical Provider, MD  gabapentin (NEURONTIN) 400 MG capsule Take 300 mg by mouth at bedtime.     Historical Provider, MD  progesterone (PROMETRIUM) 100 MG capsule Take 100 mg by mouth daily.     Historical Provider, MD   BP 106/52 mmHg  Pulse 64  Temp(Src) 97.8 F (36.6 C) (Oral)  Resp 16  Ht 5\' 7"  (1.702 m)  Wt 160 lb (72.576 kg)  BMI 25.05 kg/m2  SpO2 97% Physical Exam  Constitutional: She is oriented to person, place, and time. No distress.  HENT:  Right Ear: External ear normal.  Left Ear: External ear normal.  Nose: Nose normal.  Mouth/Throat: Oropharynx is clear and moist. No oropharyngeal exudate.  Eyes: Conjunctivae and EOM are normal. Pupils are equal, round, and reactive to light.  Neck: Normal range of motion. Neck supple.  Cardiovascular: Normal rate, regular rhythm, normal heart sounds and intact distal pulses.   No murmur heard. Pulmonary/Chest: Effort normal and breath sounds normal. No respiratory distress. She has no wheezes.  Abdominal: Soft. Bowel sounds are normal. She exhibits no distension. There is no tenderness. There is no rebound and no guarding.  Musculoskeletal: Normal range of motion. She exhibits no edema or tenderness.  Lymphadenopathy:    She has no cervical adenopathy.  Neurological: She is alert and oriented to person, place, and time. She has normal reflexes. No cranial nerve deficit. Coordination normal.  Skin: She is not diaphoretic.  Psychiatric: She has a normal mood and affect. Her behavior is normal.  Nursing note and vitals reviewed.   ED Course  Procedures (including  critical care time) Labs Review Labs Reviewed - No data to display  Imaging Review No results found. I have personally reviewed and evaluated these images and lab results as part of my medical decision-making.   EKG Interpretation None      MDM   Final diagnoses:  Headache, unspecified headache type    No meningismus on exam. No focal neuro deficits. Exam completely benign. Suspicion low for thromboembolic, infectious, or hemorrhagic process. CT scan from 05/2014 completely normal. I do not believe imaging is indicated at this time. Gave migraine  cocktail which resolved pt's headache. Discussed w/ pt that her estrogen therapy puts her at increased risk of clots and discussed return precautions thoroughly. Patient verbalized her understanding.     Carlene Coria, PA-C 09/26/15 6213  Derwood Kaplan, MD 10/15/15 [phone]

## 2018-01-30 ENCOUNTER — Encounter (HOSPITAL_BASED_OUTPATIENT_CLINIC_OR_DEPARTMENT_OTHER): Payer: Self-pay

## 2018-01-30 ENCOUNTER — Emergency Department (HOSPITAL_BASED_OUTPATIENT_CLINIC_OR_DEPARTMENT_OTHER): Payer: BLUE CROSS/BLUE SHIELD

## 2018-01-30 ENCOUNTER — Emergency Department (HOSPITAL_BASED_OUTPATIENT_CLINIC_OR_DEPARTMENT_OTHER)
Admission: EM | Admit: 2018-01-30 | Discharge: 2018-01-30 | Disposition: A | Discharge status: Home / Self Care | Payer: BLUE CROSS/BLUE SHIELD | Attending: Emergency Medicine | Admitting: Emergency Medicine

## 2018-01-30 ENCOUNTER — Other Ambulatory Visit: Payer: Self-pay

## 2018-01-30 DIAGNOSIS — K219 Gastro-esophageal reflux disease without esophagitis: Secondary | ICD-10-CM | POA: Diagnosis not present

## 2018-01-30 DIAGNOSIS — N281 Cyst of kidney, acquired: Secondary | ICD-10-CM | POA: Insufficient documentation

## 2018-01-30 DIAGNOSIS — Z79899 Other long term (current) drug therapy: Secondary | ICD-10-CM | POA: Insufficient documentation

## 2018-01-30 DIAGNOSIS — Z87891 Personal history of nicotine dependence: Secondary | ICD-10-CM | POA: Insufficient documentation

## 2018-01-30 DIAGNOSIS — K529 Noninfective gastroenteritis and colitis, unspecified: Secondary | ICD-10-CM | POA: Insufficient documentation

## 2018-01-30 DIAGNOSIS — R1013 Epigastric pain: Secondary | ICD-10-CM

## 2018-01-30 DIAGNOSIS — K7689 Other specified diseases of liver: Secondary | ICD-10-CM | POA: Insufficient documentation

## 2018-01-30 DIAGNOSIS — Q446 Cystic disease of liver: Secondary | ICD-10-CM

## 2018-01-30 DIAGNOSIS — Q612 Polycystic kidney, adult type: Secondary | ICD-10-CM

## 2018-01-30 DIAGNOSIS — R109 Unspecified abdominal pain: Secondary | ICD-10-CM | POA: Diagnosis present

## 2018-01-30 LAB — COMPREHENSIVE METABOLIC PANEL
ALT: 16 U/L (ref 14–54)
ANION GAP: 9 (ref 5–15)
AST: 26 U/L (ref 15–41)
Albumin: 4 g/dL (ref 3.5–5.0)
Alkaline Phosphatase: 57 U/L (ref 38–126)
BILIRUBIN TOTAL: 0.5 mg/dL (ref 0.3–1.2)
BUN: 13 mg/dL (ref 6–20)
CHLORIDE: 108 mmol/L (ref 101–111)
CO2: 21 mmol/L — AB (ref 22–32)
Calcium: 9.5 mg/dL (ref 8.9–10.3)
Creatinine, Ser: 0.97 mg/dL (ref 0.44–1.00)
GFR calc Af Amer: >60 mL/min (ref >60)
GFR calc non Af Amer: >60 mL/min (ref >60)
Glucose, Bld: 85 mg/dL (ref 65–99)
Potassium: 4.1 mmol/L (ref 3.5–5.1)
Sodium: 138 mmol/L (ref 135–145)
TOTAL PROTEIN: 7 g/dL (ref 6.5–8.1)

## 2018-01-30 LAB — CBC WITH DIFFERENTIAL/PLATELET
BASOS ABS: 0 10*3/uL (ref 0.0–0.1)
BASOS PCT: 0 %
Eosinophils Absolute: 0 10*3/uL (ref 0.0–0.7)
Eosinophils Relative: 0 %
HEMATOCRIT: 40.8 % (ref 36.0–46.0)
Hemoglobin: 13.5 g/dL (ref 12.0–15.0)
Lymphocytes Relative: 37 %
Lymphs Abs: 1.3 10*3/uL (ref 0.7–4.0)
MCH: 31.8 pg (ref 26.0–34.0)
MCHC: 33.1 g/dL (ref 30.0–36.0)
MCV: 96.2 fL (ref 78.0–100.0)
Monocytes Absolute: 0.5 10*3/uL (ref 0.1–1.0)
Monocytes Relative: 14 %
Neutro Abs: 1.7 10*3/uL (ref 1.7–7.7)
Neutrophils Relative %: 49 %
PLATELETS: 164 10*3/uL (ref 150–400)
RBC: 4.24 MIL/uL (ref 3.87–5.11)
RDW: 12.1 % (ref 11.5–15.5)
WBC: 3.4 10*3/uL — ABNORMAL LOW (ref 4.0–10.5)

## 2018-01-30 LAB — URINALYSIS, ROUTINE W REFLEX MICROSCOPIC
Bilirubin Urine: NEGATIVE
Glucose, UA: NEGATIVE mg/dL
Hgb urine dipstick: NEGATIVE
KETONES UR: NEGATIVE mg/dL
Leukocytes, UA: NEGATIVE
NITRITE: NEGATIVE
Protein, ur: NEGATIVE mg/dL
Specific Gravity, Urine: <1.005 — ABNORMAL LOW (ref 1.005–1.030)
pH: 6.5 (ref 5.0–8.0)

## 2018-01-30 LAB — LIPASE, BLOOD: Lipase: 28 U/L (ref 11–51)

## 2018-01-30 MED ORDER — METRONIDAZOLE 500 MG PO TABS: 500.0000 mg | ORAL_TABLET | Freq: Three times a day (TID) | ORAL | 0 refills | Status: DC

## 2018-01-30 MED ORDER — CIPROFLOXACIN HCL 500 MG PO TABS: 500.0000 mg | ORAL_TABLET | Freq: Two times a day (BID) | ORAL | 0 refills | Status: DC

## 2018-01-30 MED ORDER — ONDANSETRON HCL 4 MG/2ML IJ SOLN
4.0000 mg | Freq: Once | INTRAMUSCULAR | Status: AC
  Administered 2018-01-30: 4 mg via INTRAVENOUS
  Filled 2018-01-30: qty 2

## 2018-01-30 MED ORDER — GI COCKTAIL ~~LOC~~
30.0000 mL | Freq: Once | ORAL | Status: AC
  Administered 2018-01-30: 30 mL via ORAL
  Filled 2018-01-30: qty 30

## 2018-01-30 MED ORDER — SODIUM CHLORIDE 0.9 % IV BOLUS (SEPSIS)
1000.0000 mL | Freq: Once | INTRAVENOUS | Status: AC
  Administered 2018-01-30: 1000 mL via INTRAVENOUS

## 2018-01-30 MED ORDER — OMEPRAZOLE 20 MG PO CPDR: 20.0000 mg | DELAYED_RELEASE_CAPSULE | Freq: Every day | ORAL | 0 refills | Status: AC

## 2018-01-30 MED ORDER — IOPAMIDOL (ISOVUE-300) INJECTION 61%
100.0000 mL | Freq: Once | INTRAVENOUS | Status: AC | PRN
  Administered 2018-01-30: 100 mL via INTRAVENOUS

## 2018-01-30 MED ORDER — FAMOTIDINE 20 MG PO TABS: 20.0000 mg | ORAL_TABLET | Freq: Two times a day (BID) | ORAL | 0 refills | Status: AC

## 2018-01-30 MED FILL — CIPROFLOXACIN HCL 500 MG TA: 500 | 10 days supply | Qty: 20 | Fill #0

## 2018-01-30 MED FILL — metroNIDAZOLE 500 MG TABS: 500 | 10 days supply | Qty: 30 | Fill #0

## 2018-01-30 NOTE — ED Notes (Signed)
2 attempts made for IV  - unsuccessful  

## 2018-01-30 NOTE — ED Triage Notes (Signed)
C/o abd pain, diarrhea started 2 days ago-1 month hx of nausea, weight loss and decreased appetite-NAD-steady gait

## 2018-01-30 NOTE — Discharge Instructions (Signed)
Take cipro and flagyl until all gone for colitis. Stop goody's powder. Take pepcid and prilosec for stomach acid reflux. You have multiple cysts to liver and kidneys, please follow up with family doctor for further evaluation.

## 2018-01-30 NOTE — ED Provider Notes (Signed)
[facility] EMERGENCY DEPARTMENT Provider Note   CSN: [id number] Arrival date & time: 01/30/18  1218     History   Chief Complaint Chief Complaint  Patient presents with  . Abdominal Pain    HPI Gwendolyn Page is a 57 y.o. female.  HPI Gwendolyn Page is a 57 y.o. female with history of hepatitis C, pancreatitis in the past, presents to emergency department complaining of abdominal pain.  Patient states that she has developed abdominal pain several days ago.  She reports nausea for over a month.  She states she has no appetite and has not been eating much.  States "food just does not taste good."  Reports early satiety.  She states she lost about 10 pounds in the last month.  She also reports chronic constipation, but admits to one episode of diarrhea several days ago.  Denies any blood in her stool.  Denies any black or tarry stools.  Denies any vomiting.  Denies any alcohol.  Denies any spicy or tomato based foods.  She does admit to taking 4-5 Goodys a day for headaches. Has not tried anything for this abdominal pain. No urinary symptoms. No fever, chills. No hx of the same.   Past Medical History:  Diagnosis Date  . Back pain   . Insomnia   . Mood swings   . Pancreatitis     There are no active problems to display for this patient.   Past Surgical History:  Procedure Laterality Date  . TUBAL LIGATION      OB History    No data available       Home Medications    Prior to Admission medications   Medication Sig Start Date End Date Taking? Authorizing Provider  TRAZODONE HCL PO Take by mouth.   Yes [provider]  UNKNOWN TO PATIENT "anxiety pill"   Yes [provider]  estrogens conjugated, synthetic A, (CENESTIN) 0.3 MG tablet Take 0.3 mg by mouth daily.    [provider]  gabapentin (NEURONTIN) 400 MG capsule Take 300 mg by mouth at bedtime.     [provider]  progesterone (PROMETRIUM) 100 MG capsule Take 100 mg by  mouth daily.    [provider]    Family History No family history on file.  Social History Social History   Tobacco Use  . Smoking status: Former Smoker    Packs/day: 0.50    Types: Cigarettes  . Smokeless tobacco: Never Used  Substance Use Topics  . Alcohol use: No  . Drug use: No     Allergies   Clindamycin/lincomycin   Review of Systems Review of Systems  Constitutional: Positive for appetite change and unexpected weight change. Negative for chills and fever.  Respiratory: Negative for cough, chest tightness and shortness of breath.   Cardiovascular: Negative for chest pain, palpitations and leg swelling.  Gastrointestinal: Positive for abdominal pain, constipation and diarrhea. Negative for nausea and vomiting.  Genitourinary: Negative for dysuria, flank pain, pelvic pain, vaginal bleeding, vaginal discharge and vaginal pain.  Musculoskeletal: Negative for arthralgias, myalgias, neck pain and neck stiffness.  Skin: Negative for rash.  Neurological: Negative for dizziness, weakness and headaches.  All other systems reviewed and are negative.    Physical Exam Updated Vital Signs BP (!) 117/91 (BP Location: Left Arm)   Pulse 83   Temp 97.8 F (36.6 C) (Oral)   Resp 18   Ht 5\' 7"  (1.702 m)   Wt 64.9 kg (143 lb)  SpO2 100%   BMI 22.40 kg/m   Physical Exam  Constitutional: She appears well-developed and well-nourished. No distress.  HENT:  Head: Normocephalic.  Eyes: Conjunctivae are normal.  Neck: Neck supple.  Cardiovascular: Normal rate, regular rhythm and normal heart sounds.  Pulmonary/Chest: Effort normal and breath sounds normal. No respiratory distress. She has no wheezes. She has no rales.  Abdominal: Soft. Bowel sounds are normal. She exhibits no distension. There is tenderness in the epigastric area. There is no rebound.  Musculoskeletal: She exhibits no edema.  Neurological: She is alert.  Skin: Skin is warm and dry.  Psychiatric:  She has a normal mood and affect. Her behavior is normal.  Nursing note and vitals reviewed.    ED Treatments / Results  Labs (all labs ordered are listed, but only abnormal results are displayed) Labs Reviewed  CBC WITH DIFFERENTIAL/PLATELET - Abnormal; Notable for the following components:      Result Value   WBC 3.4 (*)    All other components within normal limits  COMPREHENSIVE METABOLIC PANEL - Abnormal; Notable for the following components:   CO2 21 (*)    All other components within normal limits  URINALYSIS, ROUTINE W REFLEX MICROSCOPIC - Abnormal; Notable for the following components:   Specific Gravity, Urine <1.005 (*)    All other components within normal limits  LIPASE, BLOOD    EKG  EKG Interpretation None       Radiology Ct Abdomen Pelvis W Contrast  Result Date: 01/30/2018 CLINICAL DATA:  Abdominal pain and diarrhea. Weight loss. History of pancreatitis. EXAM: CT ABDOMEN AND PELVIS WITH CONTRAST TECHNIQUE: Multidetector CT imaging of the abdomen and pelvis was performed using the standard protocol following bolus administration of intravenous contrast. CONTRAST:  ISOVUE-300 IOPAMIDOL (ISOVUE-300) INJECTION 61% COMPARISON:  January 12, 2014 FINDINGS: Lower chest: There is bibasilar atelectatic change. Lung bases otherwise clear. Hepatobiliary: There are cysts throughout the liver. Cysts range in size from as small as 3 mm to as large as 3.5 x 3.1 cm. This largest cyst is in the right liver dome region. No noncystic liver lesions are evident. Gallbladder wall is not appreciably thickened. The proximal common bile duct measures 9 mm which is prominent but stable. No biliary duct mass or calculus is demonstrated on this study. Pancreas: There is no evident pancreatic mass or inflammatory focus. The pancreatic duct is not dilated. Spleen: No splenic lesions are evident. Adrenals/Urinary Tract: Adrenals bilaterally appear unremarkable. There are multiple subcentimeter  cysts in each kidney. There is a cyst in the anterior mid left kidney measuring 1.4 x 1.3 cm. There is an extrarenal pelvis on each side. There is no hydronephrosis on either side. There is no renal or ureteral calculus on either side. Urinary bladder is midline with wall thickness within normal limits. Stomach/Bowel: There is moderate stool in fluid throughout the colon. There is mild wall thickening in the distal descending colon without diverticular disease in this area. There is no adjacent mesenteric thickening. This areas felt to represent a segment of nonspecific colitis. There is no appreciable small bowel wall thickening or dilatation. No evident bowel obstruction. No free air or portal venous air. Vascular/Lymphatic: There are foci of atherosclerotic calcification in the aorta and right common iliac artery. No aneurysm evident. Major mesenteric vessels appear patent. No adenopathy appreciable in the abdomen or pelvis. Reproductive: Uterus is anteverted. There is no pelvic mass. There is a moderate amount of fluid tracking from the right adnexa into the dependent portion  of the pelvis. Other: Appendix appears unremarkable. There is no abscess in the abdomen or pelvis. There is a minimal ventral hernia containing only fat. Musculoskeletal: There are no blastic or lytic bone lesions. There is degenerative change in the lumbar spine. There is slight anterolisthesis of L4 on L5 with pars defects at L4 bilaterally. No intramuscular or abdominal wall lesions evident. IMPRESSION: 1. There is mild wall thickening in the distal descending colon diverticula or mesenteric thickening. Suspect a focal area of nonspecific colitis. Small bowel appears unremarkable. 2. Moderate serous type fluid tracking from the right adnexa into the dependent portion the pelvis. Suspect ovarian cyst rupture. 3.  Appendix appears normal.  No bowel obstruction.  No abscess. 4. Multiple liver cysts again noted. Multiple small kidney cysts  also present. Suspect a degree of adult polycystic kidney disease. 5.   No renal or ureteral calculus.  No hydronephrosis. 6. Stable prominence of the common bile duct without mass or calculus in the biliary ductal system. 7.  No pancreatic lesions evident. 8.  Aortoiliac atherosclerosis. 9.  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 Aortic Atherosclerosis (ICD10-I70.0). Electronically Signed   By: Bretta BangWilliam  Woodruff III M.D.   On: 01/30/2018 15:41    Procedures Procedures (including critical care time)  Medications Ordered in ED Medications  gi cocktail (Maalox,Lidocaine,Donnatal) (30 mLs Oral Given 01/30/18 1448)  ondansetron (ZOFRAN) injection 4 mg (4 mg Intravenous Given 01/30/18 1447)  sodium chloride 0.9 % bolus 1,000 mL (0 mLs Intravenous Stopped 01/30/18 1602)  iopamidol (ISOVUE-300) 61 % injection 100 mL (100 mLs Intravenous Contrast Given 01/30/18 1517)     Initial Impression / Assessment and Plan / ED Course  I have reviewed the triage vital signs and the nursing notes.  Pertinent labs & imaging results that were available during my care of the patient were reviewed by me and considered in my medical decision making (see chart for details).     Pt with epigastric pain, early satiety, recent weight loss, changes in her bowels.  Her abdomen is soft, benign, she does have epigastric tenderness.  Differential includes pancreatitis, hepatitis, gastritis, peptic ulcer disease, malignancy.  Will get labs and CT abdomen and pelvis for further evaluation.  4:28 PM CT abdomen and pelvis showing possible colitis.  Also showing multiple cysts on the kidneys and liver.  It is also showing possible ruptured ovarian cyst, however patient is not having right adnexa.  Will start on Cipro and Flagyl for colitis.  Will start on acid reflux medications for possible acid reflux which I think is what is causing most of her symptoms.  We discussed results findings and the fact that she must  follow-up with a family doctor for further evaluation of her cystic disease.  Patient agreed.  At this time her electrolytes and labs unremarkable, she does not look particularly dehydrated even though she states she has only had one bottle of water over the last 2 days did give her 1 L of normal saline.  She is not vomiting.  Outpatient follow-up.  Vitals:   01/30/18 1227 01/30/18 1441  BP: (!) 117/91 126/68  Pulse: 83 76  Resp: 18 16  Temp: 97.8 F (36.6 C)   TempSrc: Oral   SpO2: 100% 99%  Weight: 64.9 kg (143 lb)   Height: 5\' 7"  (1.702 m)      Final Clinical Impressions(s) / ED Diagnoses   Final diagnoses:  Epigastric pain  Colitis  Gastroesophageal reflux disease, esophagitis presence not specified  Polycystic kidney, adult type  Cystic disease of liver    ED Discharge Orders        Ordered    ciprofloxacin (CIPRO) 500 MG tablet  2 times daily     01/30/18 1626    metroNIDAZOLE (FLAGYL) 500 MG tablet  3 times daily     01/30/18 1626    famotidine (PEPCID) 20 MG tablet  2 times daily     01/30/18 1626    omeprazole (PRILOSEC) 20 MG capsule  Daily     01/30/18 1626       Jaynie Crumble, PA-C 01/30/18 1630    Doug Sou, MD 01/30/18 1640

## 2018-04-09 IMAGING — CT CT ABD-PELV W/ CM
2 of 5 series · 15 of 46 positions shown, 17 images · IV contrast (iopamidol)
Comparison: January 12, 2014

CLINICAL DATA: Abdominal pain and diarrhea. Weight loss. History of
pancreatitis.

EXAM:
CT ABDOMEN AND PELVIS WITH CONTRAST
TECHNIQUE: Multidetector CT imaging of the abdomen and pelvis was performed
using the standard protocol following bolus administration of
intravenous contrast.
CONTRAST:  100mL RN1EYE-ACC IOPAMIDOL (RN1EYE-ACC) INJECTION 61%

[Series 2: axial st · axial · 0.76mm/px · z∈[-426,-36]mm · 12 of 90 slices shown, 14 images]
[im 6/90  soft-tissue]
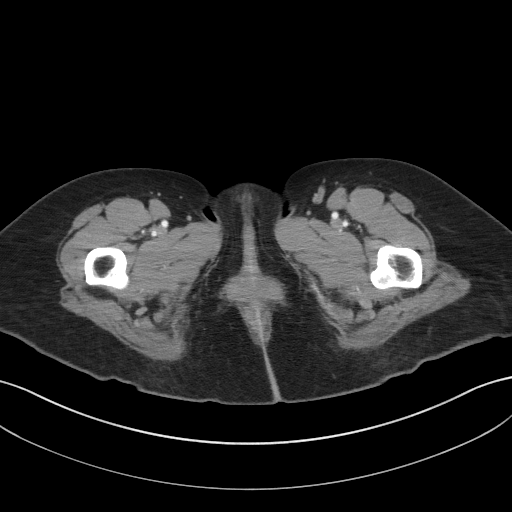
[im 6/90  bone]
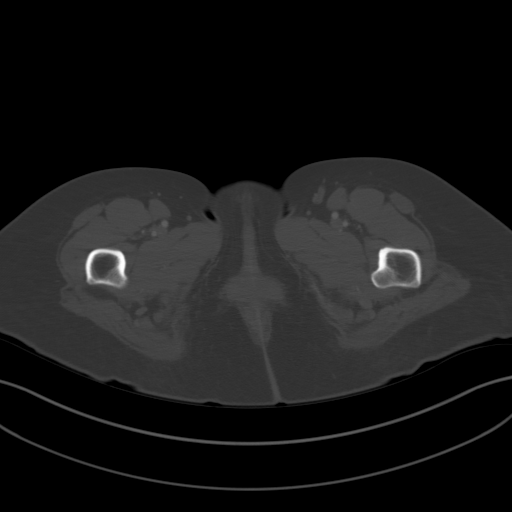
[im 16/90  soft-tissue]
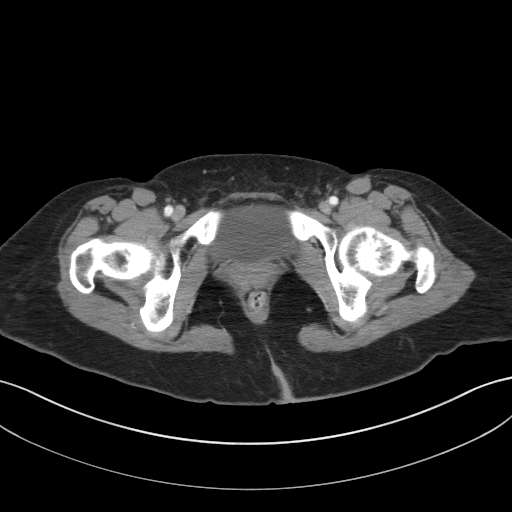
[im 21/90  soft-tissue]
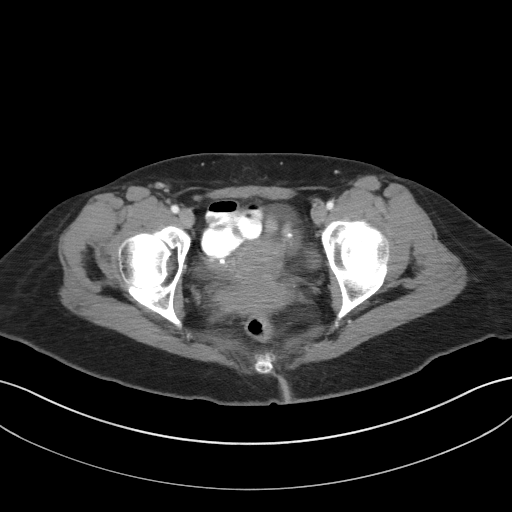
[im 27/90  soft-tissue]
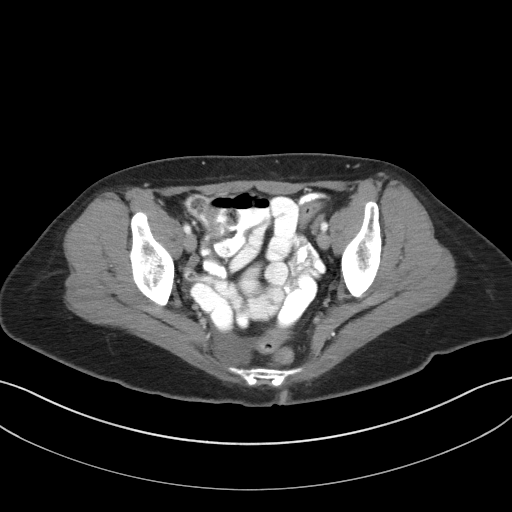
[im 37/90  soft-tissue]
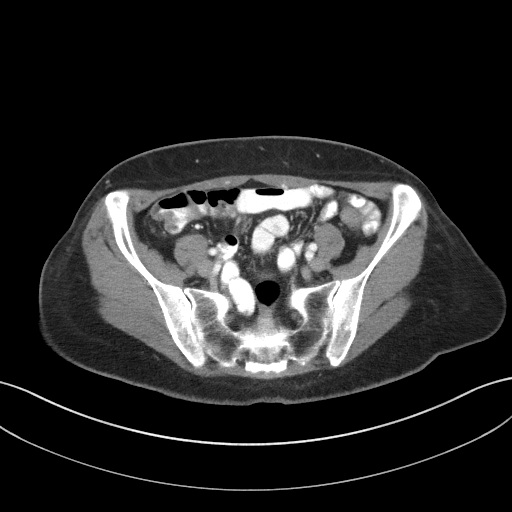
[im 42/90  soft-tissue]
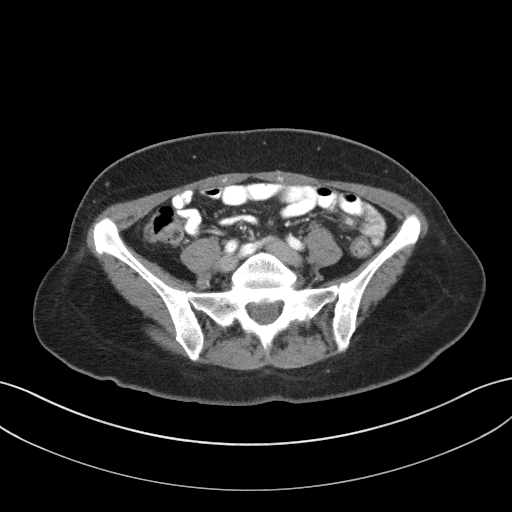
[im 48/90  soft-tissue]
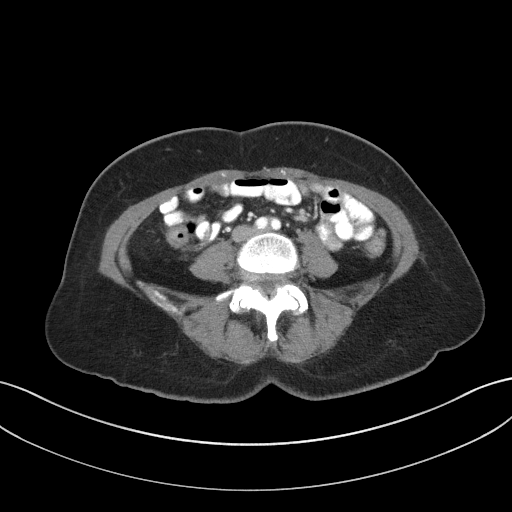
[im 58/90  soft-tissue]
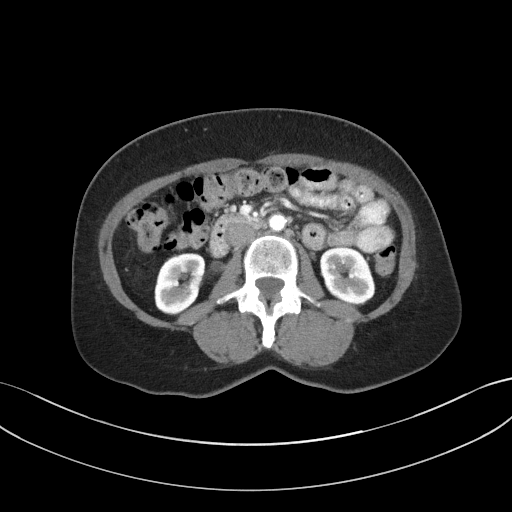
[im 63/90  soft-tissue]
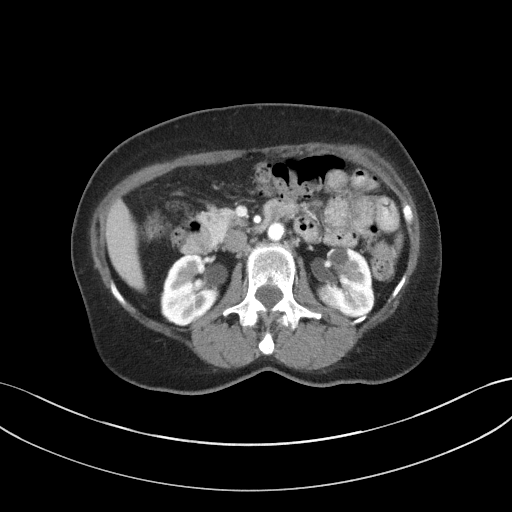
[im 63/90  bone]
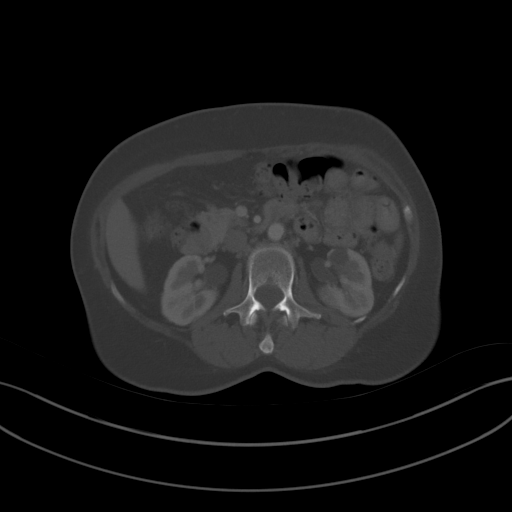
[im 69/90  soft-tissue]
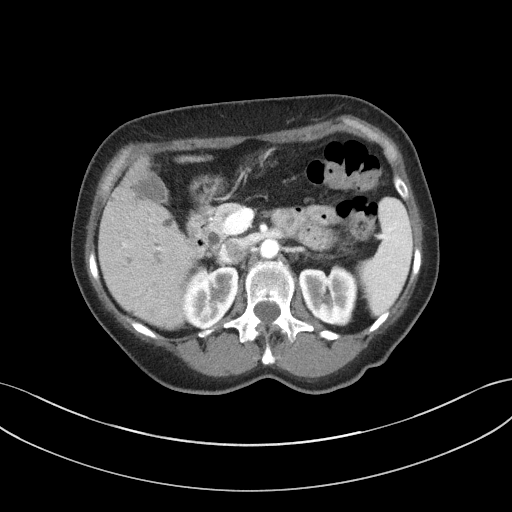
[im 79/90  soft-tissue]
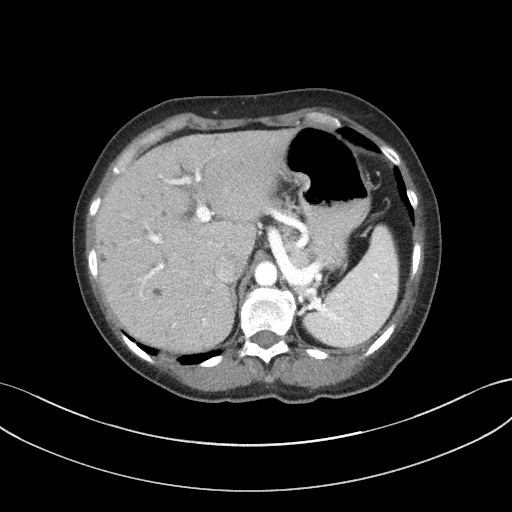
[im 84/90  soft-tissue]
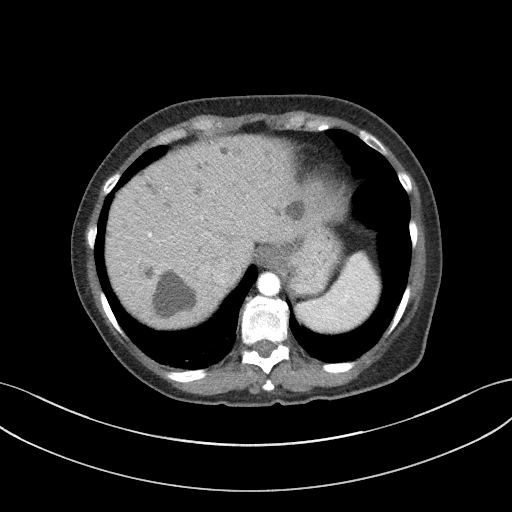

[Series 5: coronal st · coronal · 0.73mm/px · 3 of 82 slices shown]
[im 28/82  soft-tissue]
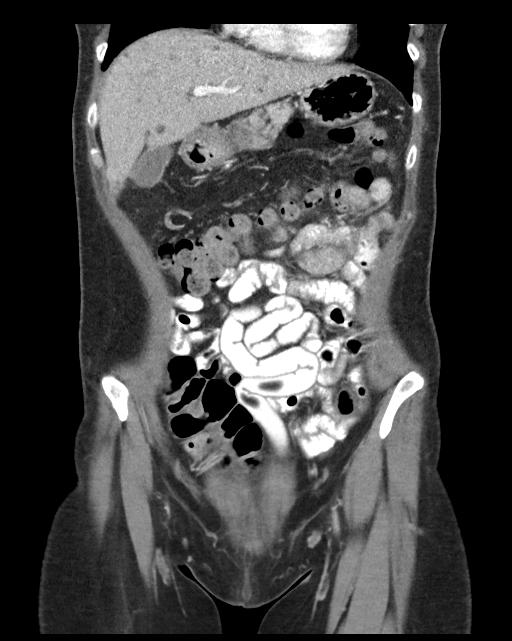
[im 37/82  soft-tissue]
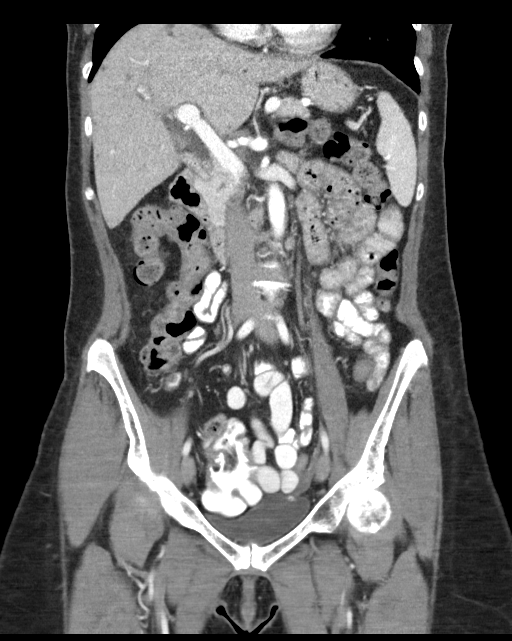
[im 46/82  soft-tissue]
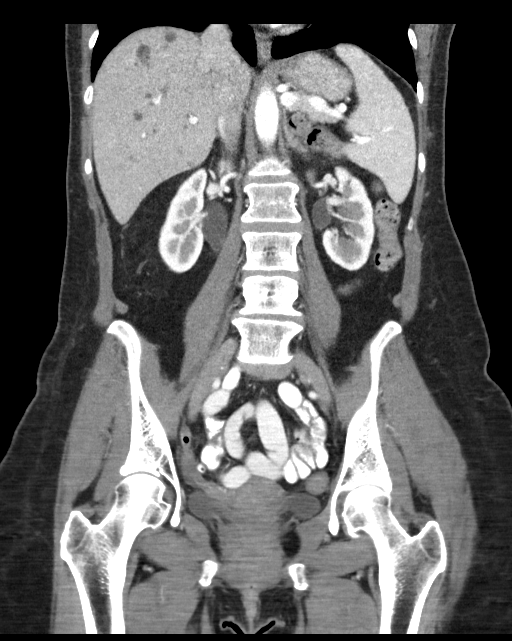

[15 of 46 positions shown; findings below may reference images not displayed]

FINDINGS: Lower chest: There is bibasilar atelectatic change. Lung bases
otherwise clear.

Hepatobiliary: There are cysts throughout the liver. Cysts range in
size from as small as 3 mm to as large as 3.5 x 3.1 cm. This largest
cyst is in the right liver dome region. No noncystic liver lesions
are evident. Gallbladder wall is not appreciably thickened. The
proximal common bile duct measures 9 mm which is prominent but
stable. No biliary duct mass or calculus is demonstrated on this
study.

Pancreas: There is no evident pancreatic mass or inflammatory focus.
The pancreatic duct is not dilated.

Spleen: No splenic lesions are evident.

Adrenals/Urinary Tract: Adrenals bilaterally appear unremarkable.
There are multiple subcentimeter cysts in each kidney. There is a
cyst in the anterior mid left kidney measuring 1.4 x 1.3 cm. There
is an extrarenal pelvis on each side. There is no hydronephrosis on
either side. There is no renal or ureteral calculus on either side.
Urinary bladder is midline with wall thickness within normal limits.

Stomach/Bowel: There is moderate stool in fluid throughout the
colon. There is mild wall thickening in the distal descending colon
without diverticular disease in this area. There is no adjacent
mesenteric thickening. This areas felt to represent a segment of
nonspecific colitis. There is no appreciable small bowel wall
thickening or dilatation. No evident bowel obstruction. No free air
or portal venous air.

Vascular/Lymphatic: There are foci of atherosclerotic calcification
in the aorta and right common iliac artery. No aneurysm evident.
Major mesenteric vessels appear patent. No adenopathy appreciable in
the abdomen or pelvis.

Reproductive: Uterus is anteverted. There is no pelvic mass. There
is a moderate amount of fluid tracking from the right adnexa into
the dependent portion of the pelvis.

Other: Appendix appears unremarkable. There is no abscess in the
abdomen or pelvis. There is a minimal ventral hernia containing only
fat.

Musculoskeletal: There are no blastic or lytic bone lesions. There
is degenerative change in the lumbar spine. There is slight
anterolisthesis of L4 on L5 with pars defects at L4 bilaterally. No
intramuscular or abdominal wall lesions evident.
IMPRESSION: 1. There is mild wall thickening in the distal descending colon
diverticula or mesenteric thickening. Suspect a focal area of
nonspecific colitis. Small bowel appears unremarkable.

2. Moderate serous type fluid tracking from the right adnexa into
the dependent portion the pelvis. Suspect ovarian cyst rupture.

3.  Appendix appears normal.  No bowel obstruction.  No abscess.

4. Multiple liver cysts again noted. Multiple small kidney cysts
also present. Suspect a degree of adult polycystic kidney disease.

5.   No renal or ureteral calculus.  No hydronephrosis.

6. Stable prominence of the common bile duct without mass or
calculus in the biliary ductal system.

7.  No pancreatic lesions evident.

8.  Aortoiliac atherosclerosis.

9.  Pars defects at L5 with mild spondylolisthesis at L4-5.

Aortic Atherosclerosis ([id number]-9SE.E).

## 2019-02-24 ENCOUNTER — Emergency Department (HOSPITAL_BASED_OUTPATIENT_CLINIC_OR_DEPARTMENT_OTHER)
Admission: EM | Admit: 2019-02-24 | Discharge: 2019-02-24 | Disposition: A | Discharge status: Home / Self Care | Payer: BLUE CROSS/BLUE SHIELD | Attending: Emergency Medicine | Admitting: Emergency Medicine

## 2019-02-24 ENCOUNTER — Encounter (HOSPITAL_BASED_OUTPATIENT_CLINIC_OR_DEPARTMENT_OTHER): Payer: Self-pay | Admitting: *Deleted

## 2019-02-24 ENCOUNTER — Other Ambulatory Visit: Payer: Self-pay

## 2019-02-24 DIAGNOSIS — Y92488 Other paved roadways as the place of occurrence of the external cause: Secondary | ICD-10-CM | POA: Insufficient documentation

## 2019-02-24 DIAGNOSIS — Z203 Contact with and (suspected) exposure to rabies: Secondary | ICD-10-CM | POA: Diagnosis not present

## 2019-02-24 DIAGNOSIS — Z79899 Other long term (current) drug therapy: Secondary | ICD-10-CM | POA: Diagnosis not present

## 2019-02-24 DIAGNOSIS — Y9301 Activity, walking, marching and hiking: Secondary | ICD-10-CM | POA: Diagnosis not present

## 2019-02-24 DIAGNOSIS — S0185XA Open bite of other part of head, initial encounter: Secondary | ICD-10-CM | POA: Diagnosis not present

## 2019-02-24 DIAGNOSIS — Z23 Encounter for immunization: Secondary | ICD-10-CM | POA: Diagnosis not present

## 2019-02-24 DIAGNOSIS — Z87891 Personal history of nicotine dependence: Secondary | ICD-10-CM | POA: Insufficient documentation

## 2019-02-24 DIAGNOSIS — W010XXA Fall on same level from slipping, tripping and stumbling without subsequent striking against object, initial encounter: Secondary | ICD-10-CM | POA: Insufficient documentation

## 2019-02-24 DIAGNOSIS — Y998 Other external cause status: Secondary | ICD-10-CM | POA: Insufficient documentation

## 2019-02-24 DIAGNOSIS — S0993XA Unspecified injury of face, initial encounter: Secondary | ICD-10-CM | POA: Diagnosis present

## 2019-02-24 DIAGNOSIS — W540XXA Bitten by dog, initial encounter: Secondary | ICD-10-CM | POA: Insufficient documentation

## 2019-02-24 MED ORDER — LIDOCAINE-EPINEPHRINE 1 %-1:100000 IJ SOLN
10.0000 mL | Freq: Once | INTRAMUSCULAR | Status: AC
  Administered 2019-02-24: 10 mL
  Filled 2019-02-24: qty 1

## 2019-02-24 MED ORDER — AMOXICILLIN-POT CLAVULANATE 875-125 MG PO TABS: 1.0000 | ORAL_TABLET | Freq: Two times a day (BID) | ORAL | 0 refills | Status: AC

## 2019-02-24 MED ORDER — RABIES VACCINE, PCEC IM SUSR
1.0000 mL | Freq: Once | INTRAMUSCULAR | Status: AC
  Administered 2019-02-24: 1 mL via INTRAMUSCULAR
  Filled 2019-02-24: qty 1

## 2019-02-24 MED ORDER — RABIES IMMUNE GLOBULIN 150 UNIT/ML IM INJ
20.0000 [IU]/kg | INJECTION | Freq: Once | INTRAMUSCULAR | Status: AC
  Administered 2019-02-24: 1425 [IU] via INTRAMUSCULAR
  Filled 2019-02-24: qty 10

## 2019-02-24 MED ORDER — LIDOCAINE-EPINEPHRINE-TETRACAINE (LET) SOLUTION
3.0000 mL | Freq: Once | NASAL | Status: AC
  Administered 2019-02-24: 3 mL via TOPICAL
  Filled 2019-02-24: qty 3

## 2019-02-24 MED FILL — AMOX-CLAV 875-125 MG TABLET: 875-125 | 7 days supply | Qty: 14 | Fill #0

## 2019-02-24 NOTE — ED Provider Notes (Signed)
[facility] EMERGENCY DEPARTMENT Provider Note   CSN: [id number] Arrival date & time: 02/24/19  1207    History   Chief Complaint Chief Complaint  Patient presents with  . Animal Bite    HPI Gwendolyn Page is a 58 y.o. female.     59yo F w/ PMH below who p/w dog bite. Just PTA, she states she was walking home on a residential street when she tripped and fell down. She reports that a small black dog ran up to her and started attacking her, biting her face. She did not hit head or lose consciousness. She went home, got in her car and drove here. She has not contacted animal control. Not sure whether dog was owned or a Engineer, maintenance. Dog was not captured. She is UTD on tetanus.  The history is provided by the patient.  Animal Bite    Past Medical History:  Diagnosis Date  . Back pain   . Insomnia   . Mood swings   . Pancreatitis     There are no active problems to display for this patient.   Past Surgical History:  Procedure Laterality Date  . TUBAL LIGATION       OB History   No obstetric history on file.      Home Medications    Prior to Admission medications   Medication Sig Start Date End Date Taking? Authorizing Provider  amoxicillin-clavulanate (AUGMENTIN) 875-125 MG tablet Take 1 tablet by mouth every 12 (twelve) hours. 02/24/19   Selby Foisy, Ambrose Finland, MD  estrogens conjugated, synthetic A, (CENESTIN) 0.3 MG tablet Take 0.3 mg by mouth daily.    [provider]  famotidine (PEPCID) 20 MG tablet Take 1 tablet (20 mg total) by mouth 2 (two) times daily. 01/30/18   Kirichenko, Tatyana, PA-C  gabapentin (NEURONTIN) 400 MG capsule Take 300 mg by mouth at bedtime.     [provider]  omeprazole (PRILOSEC) 20 MG capsule Take 1 capsule (20 mg total) by mouth daily. 01/30/18   Kirichenko, Lemont Fillers, PA-C  progesterone (PROMETRIUM) 100 MG capsule Take 100 mg by mouth daily.    [provider]  TRAZODONE HCL PO Take by mouth.    [provider]  UNKNOWN TO PATIENT "anxiety pill"    [provider]    Family History History reviewed. No pertinent family history.  Social History Social History   Tobacco Use  . Smoking status: Former Smoker    Packs/day: 0.50    Types: Cigarettes  . Smokeless tobacco: Never Used  Substance Use Topics  . Alcohol use: No  . Drug use: No     Allergies   Clindamycin/lincomycin   Review of Systems Review of Systems All other systems reviewed and are negative except that which was mentioned in HPI   Physical Exam Updated Vital Signs BP 132/88 (BP Location: Left Arm)   Pulse 95   Temp (!) 97.4 F (36.3 C)   Resp 18   Ht 5\' 7"  (1.702 m)   Wt 70.8 kg   SpO2 97%   BMI 24.43 kg/m   Physical Exam Vitals signs and nursing note reviewed.  Constitutional:      General: She is not in acute distress.    Appearance: She is well-developed.  HENT:     Head: Normocephalic.     Comments: Linear abrasions R face on nasal bridge extending down onto central upper lip; upper lip split with jagged disruption of Vermillion border    Ears:  Comments: Abrasion top of R ear, superficial    Nose:     Comments: No epistaxis    Mouth/Throat:     Mouth: Mucous membranes are moist.  Eyes:     Conjunctiva/sclera: Conjunctivae normal.  Neck:     Musculoskeletal: Neck supple.  Skin:    General: Skin is warm and dry.  Neurological:     Mental Status: She is alert and oriented to person, place, and time.     Gait: Gait normal.  Psychiatric:        Mood and Affect: Mood is anxious.      ED Treatments / Results  Labs (all labs ordered are listed, but only abnormal results are displayed) Labs Reviewed - No data to display  EKG None  Radiology No results found.  Procedures .Marland KitchenLaceration Repair Date/Time: 02/24/2019 2:00 PM Performed by: Laurence Spates, MD Authorized by: Laurence Spates, MD   Consent:    Consent obtained:  Verbal   Consent  given by:  Patient   Risks discussed:  Infection, pain, poor cosmetic result, poor wound healing and need for additional repair Anesthesia (see MAR for exact dosages):    Anesthesia method:  Topical application and local infiltration   Topical anesthetic:  LET   Local anesthetic:  Lidocaine 1% WITH epi Laceration details:    Location:  Lip   Lip location:  Upper interior lip and upper exterior lip   Length (cm):  1   Depth (mm):  4 Repair type:    Repair type:  Intermediate Pre-procedure details:    Preparation:  Patient was prepped and draped in usual sterile fashion Treatment:    Area cleansed with:  Betadine   Amount of cleaning:  Standard   Irrigation solution:  Sterile saline   Irrigation method:  Pressure wash Subcutaneous repair:    Suture size:  6-0   Suture material:  Vicryl   Suture technique:  Simple interrupted   Number of sutures:  1 Mucous membrane repair:    Suture size:  5-0   Suture material:  Fast-absorbing gut   Suture technique:  Simple interrupted   Number of sutures:  5 Skin repair:    Repair method:  Sutures   Suture size:  5-0   Suture material:  Fast-absorbing gut Approximation:    Approximation:  Close   Vermilion border: well-aligned   Post-procedure details:    Dressing:  Antibiotic ointment   Patient tolerance of procedure:  Tolerated well, no immediate complications   (including critical care time)  Medications Ordered in ED Medications  lidocaine-EPINEPHrine-tetracaine (LET) solution (3 mLs Topical Given 02/24/19 1247)  lidocaine-EPINEPHrine (XYLOCAINE W/EPI) 1 %-1:100000 (with pres) injection 10 mL (10 mLs Other Given 02/24/19 1249)  rabies immune globulin (HYPERAB/KEDRAB) injection 1,425 Units (1,425 Units Intramuscular Given 02/24/19 1309)  rabies vaccine (RABAVERT) injection 1 mL (1 mL Intramuscular Given 02/24/19 1306)     Initial Impression / Assessment and Plan / ED Course  I have reviewed the triage vital signs and the nursing  notes.         Because animal was not captured, recommended full rabies ppx. Gave Ig and vaccine here, discussed importance of strict adherence to vaccine schedule. Wounds cleaned at bedside, see repair note for details. Explained that she will likely have scarring as there was tissue loss at the vermillion border and philtrum. Provided w/ plastics f/u in case she has problems with wound healing/scarring. Will start on Augmentin. Animal control contacted by nursing staff.  Extensively reviewed return precautions.   Final Clinical Impressions(s) / ED Diagnoses   Final diagnoses:  Dog bite of face, initial encounter  Need for post exposure prophylaxis for rabies    ED Discharge Orders         Ordered    amoxicillin-clavulanate (AUGMENTIN) 875-125 MG tablet  Every 12 hours     02/24/19 1359           Stevon Gough, Ambrose Finlandachel Morgan, MD 02/24/19 [phone]

## 2019-02-24 NOTE — ED Notes (Signed)
ED Provider at bedside. 

## 2019-02-24 NOTE — ED Triage Notes (Signed)
Pt c/o animal bite x 30 mins ago , unknown dog , lac to upper lip and abrasions to nose and face

## 2023-07-12 NOTE — Discharge Summary (Signed)
 AMA Discharge Summary   Demographics: Gwendolyn Page  62 y.o. Oct 02, 1961 MRN: [id number]    Extended Emergency Contact Information Primary Emergency Contact: Stephenson,Tabitha Mobile Phone: [phone] Relation: Daughter  Full Code  Admit Date: 07/12/2023                            Attending Physician: No att. providers found Discharge Date: 07/12/2023  Primary Care Provider: Lauraine Theo Annis DEVONNA   [phone]  Consults during this admission: Consult Orders             IP CONSULT TO HOSPITALIST       Provider:  (Not yet assigned)              Active & Resolved Diagnosis: Principal Problem:   Acute hypoxemic respiratory failure (CMS/HCC) Active Problems:   Anxiety   Tobacco dependence   Intractable migraine with aura without status migrainosus   Osteoporosis   Hepatitis C virus infection without hepatic coma   Bipolar disease, chronic (HCC)   Opioid abuse (HCC) Resolved Problems:   * No resolved hospital problems. *   Disposition: Patient discharged to Home in critical condition.   Discharge follow-up recommendations : See [facility] Course:  Refer to H&P from the same day. Patient was admitted for hypoxic atrial failure and was pending workup to evaluate for congestive heart failure. As soon as she went upstairs in the room patient was refusing care, she refused her Lasix and decided to leave AMA.  Her son was here I talked to her on phone but she was adamant about leaving, discussed about the consequences of not completing the workup and care but patient did not wanted to listen and wanted to go home.   Gwendolyn Page wishes to leave the hospital at this time and I have advised the patient I would prefer that they remain for further evaluation and treatment. Gwendolyn Page she did not give me exact reasoning for leaving .I have explained the risks of leaving, including continued discomfort, disability, worsening of  respiratory status and in some cases death. The patient's mental status is very clear and Gwendolyn Page understands very well the implications of the decision to leave and understands that they may return at any time. I have no legal or ethical basis on which to hold this patient against their will.  She left against medical advice with reinforcement to continue outpatient followup with their primary doctor, or to return to the ER at any time.                Wound / Incision Assessment: Refer to Chart Review and Media Tab for images if available.      Temp:  [97.3 F (36.3 C)-98 F (36.7 C)] 97.3 F (36.3 C) Heart Rate:  [81-116] 106 Resp:  [16-20] 18 BP: (109-141)/(67-85) 141/82      Discharge Medications     Unreviewed Medications      Sig Disp Refill Start End  buprenorphine-naloxone 8-2 mg Subl Commonly known as: SUBOXONE  Place 1 tablet under the tongue daily.   0     gabapentin 600 mg tablet Commonly known as: NEURONTIN  Take 600 mg by mouth 3 (three) times a day.  90 tablet  2     ipratropium-albuteroL  0.5-2.5 mg/3 mL nebulizer solution Commonly known as: DUO-NEB  Take 3 mL by nebulization every 6 (six) hours as needed for  wheezing.   0     lamoTRIgine 100 mg tablet Commonly known as: LaMICtal  Indications: psychiatric disorder  30 tablet  2     QUEtiapine 200 mg tablet Commonly known as: SEROquel  Take 200 mg by mouth at bedtime.   0     zolpidem 10 mg tablet Commonly known as: AMBIEN  Take 10 mg by mouth at bedtime.   0             Lab Results  Component Value Date/Time   HGB 12.8 07/12/2023 12:56 PM   HCT 38.9 07/12/2023 12:56 PM   WBC 4.44 07/12/2023 12:56 PM   PLT 132 (L) 07/12/2023 12:56 PM   Lab Results  Component Value Date/Time   NA 140 07/12/2023 12:56 PM   K 4.4 07/12/2023 12:56 PM   CREATININE 1.09 07/12/2023 12:56 PM   BUN 13 07/12/2023 12:56 PM   GLUCOSE 112 (H) 07/12/2023 12:56 PM    Pertinent  Imaging: CT Angio Chest Pulmonary Embolism  Final Result by Manford Cummins, MD (08/08 1549)  CLINICAL DATA:  Dyspnea and positive D-dimer    EXAM:  CT ANGIOGRAPHY CHEST WITH CONTRAST    TECHNIQUE:  Multidetector CT imaging of the chest was performed using the  standard protocol during bolus administration of intravenous  contrast. Multiplanar CT image reconstructions and MIPs were  obtained to evaluate the vascular anatomy.    RADIATION DOSE REDUCTION: This exam was performed according to the  departmental dose-optimization program which includes automated  exposure control, adjustment of the mA and/or kV according to  patient size and/or use of iterative reconstruction technique.    CONTRAST:  75 mL iohexoL (OMNIPAQUE) 350 mg iodine/mL injection  (MDV) 75 mL    COMPARISON:  Chest radiograph dated 07/12/2023, CT chest dated  08/18/2003    FINDINGS:  Cardiovascular: The study is high quality for the evaluation of  pulmonary embolism. There are no filling defects in the central,  lobar, segmental or subsegmental pulmonary artery branches to  suggest acute pulmonary embolism. Great vessels are normal in course  and caliber. Normal heart size. No significant pericardial  fluid/thickening. Aortic atherosclerosis.    Mediastinum/Nodes: Imaged thyroid gland without nodules meeting  criteria for imaging follow-up by size. Normal esophagus. 1.3 cm  right hilar lymph node (5:55) and subcarinal lymph node measuring  1.1 cm (5:54).    Lungs/Pleura: The central airways are patent. Mild diffuse bronchial  wall thickening. Mild bilateral interstitial thickening. Multifocal  irregular nodules measuring up to 1.7 x 1.1 cm in the left apex  (7:21), which demonstrates subtle central lucency. Several of the  nodules also appear to demonstrate feeding vessels. No pneumothorax.  Trace right pleural effusion.    Upper abdomen: Multifocal hepatic hypodensities, many of which are  similar dating back  to 08/18/2003, likely.    Musculoskeletal: No acute or abnormal lytic or blastic osseous  lesions.    Review of the MIP images confirms the above findings.    IMPRESSION:  1. No evidence of pulmonary embolism.  2. Multifocal irregular nodules measuring up to 1.7 x 1.1 cm in the  left apex. Differential includes infection, including atypical  etiologies, septic pulmonary emboli, and malignancy. Non-contrast  chest CT at 3-6 months is recommended.  3. Mild bilateral interstitial thickening, likely pulmonary edema.  Trace right pleural effusion.  4. Mildly enlarged right hilar and subcarinal lymph nodes, likely  reactive.  5.  Aortic Atherosclerosis (ICD10-I70.0).      Electronically Signed  By: Limin  Xu M.D.    On: 07/12/2023 15:49      XR Chest 1 View  Final Result by Kate Almarie Plummer, MD (08/08 1334)  CLINICAL DATA:  dyspnea    EXAM:  CHEST  1 VIEW    COMPARISON:  Chest x-ray 05/11/2023    FINDINGS:  Limited evaluation of the right apex due to overlying mandible.    The heart and mediastinal contours are unchanged. Atherosclerotic  plaque.    Left apical nodular like airspace opacity. Chronic coarsened  interstitial markings with no overt pulmonary edema. Nonspecific  blunting of bilateral costophrenic angles with possible trace  pleural effusions. No pneumothorax.    No acute osseous abnormality.    IMPRESSION:  1. Left apical nodular-like airspace opacity. Followup PA and  lateral chest X-ray is recommended in 3-4 weeks following therapy to  ensure resolution and exclude underlying malignancy.  2. Nonspecific blunting of bilateral costophrenic angles with  possible trace pleural effusions.  3. Limited evaluation of the right apex due to overlying mandible.  4. Recommend repeat PA and lateral view of the chest for further  evaluation  5.  Aortic Atherosclerosis (ICD10-I70.0).      Electronically Signed    By: Morgane  Naveau M.D.    On:  07/12/2023 13:34        Electronically signed by: Glo Stanly Oris, MD 07/12/2023 11:03 PM   Time spent on discharge: 25 minutes

## 2024-07-17 ENCOUNTER — Encounter (HOSPITAL_BASED_OUTPATIENT_CLINIC_OR_DEPARTMENT_OTHER): Payer: Self-pay | Admitting: Emergency Medicine

## 2024-07-17 ENCOUNTER — Emergency Department (HOSPITAL_BASED_OUTPATIENT_CLINIC_OR_DEPARTMENT_OTHER): Admission: EM | Admit: 2024-07-17 | Discharge: 2024-07-17 | Discharge status: Against Medical Advice

## 2024-07-17 ENCOUNTER — Other Ambulatory Visit: Payer: Self-pay

## 2024-07-17 DIAGNOSIS — Z5329 Procedure and treatment not carried out because of patient's decision for other reasons: Secondary | ICD-10-CM | POA: Insufficient documentation

## 2024-07-17 DIAGNOSIS — K0889 Other specified disorders of teeth and supporting structures: Secondary | ICD-10-CM | POA: Insufficient documentation

## 2024-07-17 DIAGNOSIS — D696 Thrombocytopenia, unspecified: Secondary | ICD-10-CM | POA: Diagnosis not present

## 2024-07-17 DIAGNOSIS — Z5321 Procedure and treatment not carried out due to patient leaving prior to being seen by health care provider: Secondary | ICD-10-CM

## 2024-07-17 LAB — BASIC METABOLIC PANEL WITH GFR
Anion gap: 12 (ref 5–15)
BUN: 11 mg/dL (ref 8–23)
CO2: 20 mmol/L — ABNORMAL LOW (ref 22–32)
Calcium: 9.5 mg/dL (ref 8.9–10.3)
Chloride: 105 mmol/L (ref 98–111)
Creatinine, Ser: 0.9 mg/dL (ref 0.44–1.00)
GFR, Estimated: >60 mL/min (ref >60)
Glucose, Bld: 108 mg/dL — ABNORMAL HIGH (ref 70–99)
Potassium: 4.5 mmol/L (ref 3.5–5.1)
Sodium: 137 mmol/L (ref 135–145)

## 2024-07-17 LAB — CBC WITH DIFFERENTIAL/PLATELET
Abs Granulocyte: 4.6 K/uL (ref 1.5–6.5)
Abs Immature Granulocytes: 0.02 K/uL (ref 0.00–0.07)
Basophils Absolute: 0 K/uL (ref 0.0–0.1)
Basophils Relative: 0 %
Eosinophils Absolute: 0.1 K/uL (ref 0.0–0.5)
Eosinophils Relative: 1 %
HCT: 39.9 % (ref 36.0–46.0)
Hemoglobin: 13.5 g/dL (ref 12.0–15.0)
Immature Granulocytes: 0 %
Lymphocytes Relative: 24 %
Lymphs Abs: 1.7 K/uL (ref 0.7–4.0)
MCH: 32.3 pg (ref 26.0–34.0)
MCHC: 33.8 g/dL (ref 30.0–36.0)
MCV: 95.5 fL (ref 80.0–100.0)
Monocytes Absolute: 0.8 K/uL (ref 0.1–1.0)
Monocytes Relative: 10 %
Neutro Abs: 4.6 K/uL (ref 1.7–7.7)
Neutrophils Relative %: 65 %
Platelets: 133 K/uL — ABNORMAL LOW (ref 150–400)
RBC: 4.18 MIL/uL (ref 3.87–5.11)
RDW: 13.2 % (ref 11.5–15.5)
WBC: 7.2 K/uL (ref 4.0–10.5)
nRBC: 0 % (ref 0.0–0.2)

## 2024-07-17 LAB — GROUP A STREP BY PCR: Group A Strep by PCR: NOT DETECTED

## 2024-07-17 NOTE — ED Provider Notes (Signed)
 [facility] Provider Note   CSN: [id number] Arrival date & time: 07/17/24  1501     Patient presents with: Dental Problem   Gwendolyn Page is a 63 y.o. female.  HPI Patient is a 63 year old female presenting today for concerns for submental and sublingual swelling and pain that started yesterday, noting that she has had increased pain when eating as well as dysphagia accompanied with odynophagia starting last night.  medical history of dental abscesses as well as poor dentition with patient noted that she had not had any dental care in the last several years.  Denies fever, headache, vision changes, trismus, tinnitus, mastoid pain.    Prior to Admission medications   Medication Sig Start Date End Date Taking? Authorizing Provider  amoxicillin -clavulanate (AUGMENTIN ) 875-125 MG tablet Take 1 tablet by mouth every 12 (twelve) hours. 02/24/19   Little, Vernell Search, MD  estrogens conjugated, synthetic A, (CENESTIN) 0.3 MG tablet Take 0.3 mg by mouth daily.    [provider]  famotidine  (PEPCID ) 20 MG tablet Take 1 tablet (20 mg total) by mouth 2 (two) times daily. 01/30/18   Kirichenko, Tatyana, PA-C  gabapentin (NEURONTIN) 400 MG capsule Take 300 mg by mouth at bedtime.     [provider]  omeprazole  (PRILOSEC) 20 MG capsule Take 1 capsule (20 mg total) by mouth daily. 01/30/18   Kirichenko, Tatyana, PA-C  progesterone (PROMETRIUM) 100 MG capsule Take 100 mg by mouth daily.    [provider]  TRAZODONE HCL PO Take by mouth.    [provider]  UNKNOWN TO PATIENT anxiety pill    [provider]    Allergies: Clindamycin /lincomycin    Review of Systems  HENT:  Positive for dental problem, facial swelling, sore throat and trouble swallowing.   All other systems reviewed and are negative.   Updated Vital Signs BP (!) 149/78 (BP Location: Right Arm)   Pulse (!) 106   Temp 97.6 F (36.4 C)  (Oral)   Resp 18   Ht 5' 7 (1.702 m)   Wt 81.6 kg   SpO2 98%   BMI 28.19 kg/m   Physical Exam Vitals and nursing note reviewed.  Constitutional:      General: She is not in acute distress.    Appearance: Normal appearance. She is not ill-appearing or diaphoretic.  HENT:     Head: Normocephalic and atraumatic.     Mouth/Throat:     Mouth: Mucous membranes are moist.     Pharynx: Oropharynx is clear.     Comments: Noted to have submental tenderness to palpation to left aspect of jaw as well as sublingual swelling and tenderness.  Uvula is midline and appears nonerythematous, nonedematous.  Patent airway. Eyes:     General: No scleral icterus.       Right eye: No discharge.        Left eye: No discharge.     Extraocular Movements: Extraocular movements intact.     Conjunctiva/sclera: Conjunctivae normal.  Cardiovascular:     Rate and Rhythm: Normal rate and regular rhythm.     Pulses: Normal pulses.     Heart sounds: Normal heart sounds. No murmur heard.    No friction rub. No gallop.  Pulmonary:     Effort: Pulmonary effort is normal. No respiratory distress.     Breath sounds: No stridor. No wheezing, rhonchi or rales.  Chest:     Chest wall: No tenderness.  Abdominal:  General: Abdomen is flat. There is no distension.     Palpations: Abdomen is soft.     Tenderness: There is no abdominal tenderness. There is no right CVA tenderness, left CVA tenderness, guarding or rebound.  Musculoskeletal:        General: No swelling, deformity or signs of injury.     Cervical back: Normal range of motion. No rigidity.     Right lower leg: No edema.     Left lower leg: No edema.  Skin:    General: Skin is warm and dry.     Findings: No bruising, erythema or lesion.  Neurological:     General: No focal deficit present.     Mental Status: She is alert and oriented to person, place, and time. Mental status is at baseline.     Sensory: No sensory deficit.     Motor: No weakness.   Psychiatric:        Mood and Affect: Mood normal.     (all labs ordered are listed, but only abnormal results are displayed) Labs Reviewed  CBC WITH DIFFERENTIAL/PLATELET - Abnormal; Notable for the following components:      Result Value   Platelets 133 (*)    All other components within normal limits  BASIC METABOLIC PANEL WITH GFR - Abnormal; Notable for the following components:   CO2 20 (*)    Glucose, Bld 108 (*)    All other components within normal limits  GROUP A STREP BY PCR    EKG: None  Radiology: No results found.  Procedures   Medications Ordered in the ED - No data to display    Medical Decision Making  This patient is a 63 year old female who presents to the ED for concern of submental swelling and some dysphagia and odynophagia present for the last day.  Noted to have some sublingual swelling as well.  On physical exam, patient is in no acute distress, afebrile, alert and orient x 4, speaking in full sentences, nontachypneic, nontachycardic.  Notably does have some Simental swelling as well as sublingual swelling and tenderness.  With her having also accompanying odynophagia and dysphagia reported by her, we will get baseline labs and CT imaging for concerns for possible deep space infection.  Labs were unremarkable.  CT scan was ordered, however patient did not want to have this done, reported to the nursing staff that she wanted to talk to provider but when provider went to go speak to her, she had eloped from the emergency department that any further evaluation.   Differential diagnoses prior to evaluation: The emergent differential diagnosis includes, but is not limited to, pharyngitis, URI, Ludwig's angina, PTA, retropharyngeal abscess, epiglottitis, sialadenitis,. This is not an exhaustive differential.   Past Medical History / Co-morbidities / Social History: Pancreatitis, dental abscesses  Additional history: Chart reviewed. Pertinent results  include:   Last seen for dental abscess in 2019.  Lab Tests/Imaging studies: I personally interpreted labs/imaging and the pertinent results include: CBC shows a mild thrombocytopenia 133, BMP unremarkable Strep test negative CT soft tissue neck with contrast pending   Medications:  I have reviewed the patients home medicines and have made adjustments as needed.  Critical Interventions:  Social Determinants of Health:  Disposition: Eloped from the emergency department without any further evaluation or treatment   Final diagnoses:  Eloped from emergency department    ED Discharge Orders     None          Beola Terrall RAMAN,  PA-C 07/17/24 1809    Francesca Elsie CROME, MD 07/17/24 1940

## 2024-07-17 NOTE — ED Notes (Signed)
 Pt left after saying she did not understand why she needed a CT, rn said she would have PA GO IN TO SEE  her and she was gone

## 2024-07-17 NOTE — ED Triage Notes (Signed)
 Pt reports dental pain to LL jaw, has missing and loose teeth, reports no dental care for several years
